# Patient Record
Sex: Male | Born: 1980 | Race: Black or African American | Hispanic: No | Smoking: Never smoker
Health system: Southern US, Community
[De-identification: ages and names within clinical notes are randomized; demographics above are authoritative.]

## PROBLEM LIST (undated history)

## (undated) DIAGNOSIS — M1990 Unspecified osteoarthritis, unspecified site: Secondary | ICD-10-CM

## (undated) DIAGNOSIS — T7840XA Allergy, unspecified, initial encounter: Secondary | ICD-10-CM

## (undated) DIAGNOSIS — F4310 Post-traumatic stress disorder, unspecified: Secondary | ICD-10-CM

## (undated) DIAGNOSIS — K219 Gastro-esophageal reflux disease without esophagitis: Secondary | ICD-10-CM

## (undated) HISTORY — DX: Post-traumatic stress disorder, unspecified: F43.10

## (undated) HISTORY — DX: Allergy, unspecified, initial encounter: T78.40XA

## (undated) HISTORY — DX: Gastro-esophageal reflux disease without esophagitis: K21.9

## (undated) HISTORY — DX: Unspecified osteoarthritis, unspecified site: M19.90

---

## 2015-03-26 DIAGNOSIS — K219 Gastro-esophageal reflux disease without esophagitis: Secondary | ICD-10-CM | POA: Insufficient documentation

## 2015-03-26 DIAGNOSIS — F419 Anxiety disorder, unspecified: Secondary | ICD-10-CM | POA: Insufficient documentation

## 2016-08-16 ENCOUNTER — Other Ambulatory Visit: Payer: Self-pay | Admitting: Adult Health

## 2016-08-16 DIAGNOSIS — R1030 Lower abdominal pain, unspecified: Secondary | ICD-10-CM

## 2016-08-23 ENCOUNTER — Ambulatory Visit: Payer: BLUE CROSS/BLUE SHIELD | Attending: Adult Health

## 2016-10-25 DIAGNOSIS — E669 Obesity, unspecified: Secondary | ICD-10-CM | POA: Insufficient documentation

## 2016-11-01 LAB — LIPID PANEL
Cholesterol: 151 (ref 0–200)
HDL: 43 (ref 35–70)
LDL Cholesterol: 93
Triglycerides: 75 (ref 40–160)

## 2016-11-01 LAB — HEPATIC FUNCTION PANEL
ALT: 11 (ref 10–40)
AST: 13 — AB (ref 14–40)

## 2016-11-01 LAB — CBC AND DIFFERENTIAL
Hemoglobin: 14 (ref 13.5–17.5)
WBC: 6.8

## 2016-11-01 LAB — BASIC METABOLIC PANEL
BUN: 13 (ref 4–21)
Creatinine: 1 (ref 0.6–1.3)
Potassium: 4.3 (ref 3.4–5.3)
Sodium: 141 (ref 137–147)

## 2017-01-10 ENCOUNTER — Encounter: Payer: Self-pay | Admitting: Emergency Medicine

## 2017-01-10 ENCOUNTER — Emergency Department
Admission: EM | Admit: 2017-01-10 | Discharge: 2017-01-10 | Disposition: A | Discharge status: Home / Self Care | Payer: BLUE CROSS/BLUE SHIELD | Attending: Emergency Medicine | Admitting: Emergency Medicine

## 2017-01-10 DIAGNOSIS — R0981 Nasal congestion: Secondary | ICD-10-CM | POA: Diagnosis present

## 2017-01-10 DIAGNOSIS — J069 Acute upper respiratory infection, unspecified: Secondary | ICD-10-CM

## 2017-01-10 DIAGNOSIS — J019 Acute sinusitis, unspecified: Secondary | ICD-10-CM | POA: Diagnosis not present

## 2017-01-10 DIAGNOSIS — H66003 Acute suppurative otitis media without spontaneous rupture of ear drum, bilateral: Secondary | ICD-10-CM | POA: Diagnosis not present

## 2017-01-10 DIAGNOSIS — J0191 Acute recurrent sinusitis, unspecified: Secondary | ICD-10-CM

## 2017-01-10 DIAGNOSIS — H66006 Acute suppurative otitis media without spontaneous rupture of ear drum, recurrent, bilateral: Secondary | ICD-10-CM

## 2017-01-10 DIAGNOSIS — B9789 Other viral agents as the cause of diseases classified elsewhere: Secondary | ICD-10-CM

## 2017-01-10 MED ORDER — HYDROCODONE-ACETAMINOPHEN 5-325 MG PO TABS
1.0000 | ORAL_TABLET | Freq: Once | ORAL | Status: AC
  Administered 2017-01-10: 1 via ORAL
  Filled 2017-01-10: qty 1

## 2017-01-10 MED ORDER — AMOXICILLIN-POT CLAVULANATE 875-125 MG PO TABS: 1.0000 | ORAL_TABLET | Freq: Two times a day (BID) | ORAL | 0 refills | Status: AC

## 2017-01-10 MED ORDER — AMOXICILLIN-POT CLAVULANATE 875-125 MG PO TABS
1.0000 | ORAL_TABLET | Freq: Once | ORAL | Status: AC
  Administered 2017-01-10: 1 via ORAL
  Filled 2017-01-10 (×2): qty 1

## 2017-01-10 MED ORDER — HYDROCODONE-HOMATROPINE 5-1.5 MG/5ML PO SYRP: 5.0000 mL | ORAL_SOLUTION | Freq: Four times a day (QID) | ORAL | 0 refills | Status: DC | PRN

## 2017-01-10 MED ORDER — ONDANSETRON 4 MG PO TBDP: ORAL_TABLET | ORAL | 0 refills | Status: DC

## 2017-01-10 NOTE — ED Triage Notes (Signed)
Pt with cough and sinus congestion

## 2017-01-10 NOTE — ED Provider Notes (Addendum)
[facility] Emergency Department Provider Note  ____________________________________________   First MD Initiated Contact with Patient 01/10/17 2012     (approximate)  I have reviewed the triage vital signs and the nursing notes.   HISTORY  Chief Complaint Cough and Nasal Congestion    HPI Christopher Harrington is a 36 y.o. male with a history of prior ear infections and sinus infections who presents for evaluation of persistent cough, sinus pain, and ear pain for the last several days.  He reports that he had a flulike illness about 2 weeks ago and after a week he was feeling better.  However, over the last several days hehas been feeling worse again.  He reports that the pain in both of his ears as severe and nothing makes it better or worse.  He feels like there is fluid in his ears.  Additionally has pain in the sinuses and the front of his face and feels a lot of nasal congestion.  He has a persistent cough that is making it difficult to sleep and it is virtually productive of yellow and green sputum.  He denies fever/chills, body aches.  Additionally, his son has been ill with vomiting and diarrhea recently and the patient also developed the symptoms starting yesterday, but he reports he is not very worried about that as he knows it is a virus and will get better.  He reports that he was last on antibiotics for his ear and sinus problems about 3 months ago.  He has seen Dr. Jenne Campus in the past.   History reviewed. No pertinent past medical history.  There are no active problems to display for this patient.   History reviewed. No pertinent surgical history.  Prior to Admission medications   Medication Sig Start Date End Date Taking? Authorizing Provider  amoxicillin-clavulanate (AUGMENTIN) 875-125 MG tablet Take 1 tablet by mouth every 12 (twelve) hours. 01/10/17 01/20/17  Loleta Rose, MD  HYDROcodone-homatropine [facility]) 5-1.5 MG/5ML syrup Take 5 mLs by mouth  every 6 (six) hours as needed for cough. 01/10/17   Loleta Rose, MD  ondansetron (ZOFRAN ODT) 4 MG disintegrating tablet Allow 1-2 tablets to dissolve in your mouth every 8 hours as needed for nausea/vomiting 01/10/17   Loleta Rose, MD    Allergies Patient has no known allergies.  No family history on file.  Social History Social History  Substance Use Topics  . Smoking status: Never Smoker  . Smokeless tobacco: Never Used  . Alcohol use No    Review of Systems Constitutional: No fever/chills Eyes: No visual changes. ENT: Nasal congestion and sinus pain.  Bilateral ear pain. Cardiovascular: Denies chest pain. Respiratory: No shortness of breath, but persistent productive cough Gastrointestinal: No abdominal pain.  No nausea, no vomiting.  No diarrhea.  No constipation. Genitourinary: Negative for dysuria. Musculoskeletal: Negative for back pain. Skin: Negative for rash. Neurological: Negative for headaches, focal weakness or numbness.  10-point ROS otherwise negative.  ____________________________________________   PHYSICAL EXAM:  VITAL SIGNS: ED Triage Vitals [01/10/17 1827]  Enc Vitals Group     BP (!) 145/77     Pulse Rate 78     Resp 20     Temp 98 F (36.7 C)     Temp Source Oral     SpO2 96 %     Weight 180 lb (81.6 kg)     Height 5\' 9"  (1.753 m)     Head Circumference      Peak Flow  Pain Score      Pain Loc      Pain Edu?      Excl. in GC?     Constitutional: Alert and oriented. Well appearing and in no acute distress. Eyes: Conjunctivae are normal. PERRL. EOMI. Head: Atraumatic. Ears:  The patient has mild erythema in bilateral canals but also has acute suppurative otitis media bilaterally.  There is no evident per perforation of the tympanic membranes.  The right is worse than the left. Nose: Nasal congestion.  No tenderness to palpation of the frontal or maxillary sinuses. Mouth/Throat: Mucous membranes are moist. Neck: No stridor.  No  meningeal signs.   Cardiovascular: Normal rate, regular rhythm. Good peripheral circulation. Grossly normal heart sounds. Respiratory: Normal respiratory effort.  No retractions. Lungs CTAB. Gastrointestinal: Soft and nontender. No distention.  Musculoskeletal: No lower extremity tenderness nor edema. No gross deformities of extremities. Neurologic:  Normal speech and language. No gross focal neurologic deficits are appreciated.  Skin:  Skin is warm, dry and intact. No rash noted. Psychiatric: Mood and affect are normal. Speech and behavior are normal.  ____________________________________________   LABS (all labs ordered are listed, but only abnormal results are displayed)  Labs Reviewed - No data to display ____________________________________________  EKG  None - EKG not ordered by ED physician ____________________________________________  RADIOLOGY   No results found.  ____________________________________________   PROCEDURES  Procedure(s) performed:   Procedures   Critical Care performed: No ____________________________________________   INITIAL IMPRESSION / ASSESSMENT AND PLAN / ED COURSE  Pertinent labs & imaging results that were available during my care of the patient were reviewed by me and considered in my medical decision making (see chart for details).  The patient is well-appearing and in no acute distress.  His lung sounds are clear in spite of his persistent cough.  I believe he most likely did have a viral illness or influenza and has some persistent viral bronchitis but there is no evidence of pneumonia superinfection and no indication for chest x-ray at this time.  However his ears do appear to be acutely infected and required antibiotics which should in theory also treat a community-acquired pneumonia.  I encouraged him to follow up with an ENT specialist given that he is having recurrent issues with infections and sinusitis.  I gave my usual and  customary return precautions.  Giving first dose here and a Norco (for cough suppression) since pharmacies are currently closed.       ____________________________________________  FINAL CLINICAL IMPRESSION(S) / ED DIAGNOSES  Final diagnoses:  Recurrent acute suppurative otitis media without spontaneous rupture of tympanic membrane of both sides  Viral URI with cough  Acute recurrent sinusitis, unspecified location     MEDICATIONS GIVEN DURING THIS VISIT:  Medications  amoxicillin-clavulanate (AUGMENTIN) 875-125 MG per tablet 1 tablet   HYDROcodone-acetaminophen (NORCO/VICODIN) 5-325 MG per tablet 1 tablet      NEW OUTPATIENT MEDICATIONS STARTED DURING THIS VISIT:  New Prescriptions   AMOXICILLIN-CLAVULANATE (AUGMENTIN) 875-125 MG TABLET    Take 1 tablet by mouth every 12 (twelve) hours.   HYDROCODONE-HOMATROPINE (HYCODAN) 5-1.5 MG/5ML SYRUP    Take 5 mLs by mouth every 6 (six) hours as needed for cough.   ONDANSETRON (ZOFRAN ODT) 4 MG DISINTEGRATING TABLET    Allow 1-2 tablets to dissolve in your mouth every 8 hours as needed for nausea/vomiting    Modified Medications   No medications on file    Discontinued Medications   No medications on file  Note:  This document was prepared using Dragon voice recognition software and may include unintentional dictation errors.    Loleta Rose, MD 01/10/17 1610    Loleta Rose, MD 01/10/17 2034

## 2017-01-10 NOTE — Discharge Instructions (Signed)
We believe that you have a viral, flulike infection that is causing most severe symptoms.  However you do appear to have an acute infection in both of your ears which has happened to him before.  We prescribed antibiotics and encourage you to take the full course (10 days).  Please follow up with Dr. Jenne CampusMcQueen or another ENT specialist since he continue having problems with your ears and your sinuses.

## 2017-01-10 NOTE — ED Notes (Signed)

## 2017-07-14 ENCOUNTER — Ambulatory Visit: Payer: Self-pay | Admitting: Internal Medicine

## 2017-10-13 ENCOUNTER — Encounter: Payer: Self-pay | Admitting: Internal Medicine

## 2017-10-16 ENCOUNTER — Ambulatory Visit: Payer: 59 | Admitting: Internal Medicine

## 2017-10-16 ENCOUNTER — Encounter: Payer: Self-pay | Admitting: Internal Medicine

## 2017-10-16 VITALS — BP 136/86 | HR 62 | Ht 69.0 in | Wt 190.0 lb

## 2017-10-16 DIAGNOSIS — H65115 Acute and subacute allergic otitis media (mucoid) (sanguinous) (serous), recurrent, left ear: Secondary | ICD-10-CM

## 2017-10-16 DIAGNOSIS — L409 Psoriasis, unspecified: Secondary | ICD-10-CM | POA: Insufficient documentation

## 2017-10-16 DIAGNOSIS — F419 Anxiety disorder, unspecified: Secondary | ICD-10-CM

## 2017-10-16 DIAGNOSIS — Z8669 Personal history of other diseases of the nervous system and sense organs: Secondary | ICD-10-CM | POA: Diagnosis not present

## 2017-10-16 DIAGNOSIS — R69 Illness, unspecified: Secondary | ICD-10-CM | POA: Diagnosis not present

## 2017-10-16 DIAGNOSIS — K219 Gastro-esophageal reflux disease without esophagitis: Secondary | ICD-10-CM

## 2017-10-16 MED ORDER — AMOXICILLIN-POT CLAVULANATE 875-125 MG PO TABS: 1.0000 | ORAL_TABLET | Freq: Two times a day (BID) | ORAL | 0 refills | Status: DC

## 2017-10-16 NOTE — Progress Notes (Signed)
Date:  10/16/2017   Name:  Christopher Harrington   DOB:  1981/06/04   MRN:  [id number]   Chief Complaint: Establish Care and Ear Pain (Left ear painful. States get ear infection once a year. Seen specialist- ) Gastroesophageal Reflux  He complains of heartburn and a sore throat. He reports no abdominal pain or no chest pain. This is a recurrent problem. The problem occurs rarely. The symptoms are aggravated by certain foods (essentially resolved after diet change). Pertinent negatives include no fatigue. He has tried a diet change for the symptoms.  Otalgia   There is pain in both ears. This is a recurrent problem. The current episode started more than 1 year ago. There has been no fever. The patient is experiencing no pain. Associated symptoms include a rash and a sore throat. Pertinent negatives include no abdominal pain or headaches. Treatments tried: flonase spray has reduced the frequency.  Rash  This is a chronic problem. The affected locations include the left elbow, right elbow and right lower leg. Rash characteristics: psoriasis. Associated symptoms include a sore throat. Pertinent negatives include no fatigue, fever or shortness of breath.     Review of Systems  Constitutional: Negative for chills, fatigue, fever and unexpected weight change.  HENT: Positive for ear pain, sinus pressure and sore throat. Negative for trouble swallowing.   Eyes: Negative for visual disturbance.  Respiratory: Negative for chest tightness and shortness of breath.   Cardiovascular: Negative for chest pain, palpitations and leg swelling.  Gastrointestinal: Positive for heartburn. Negative for abdominal pain.  Musculoskeletal: Negative for arthralgias and gait problem.  Skin: Positive for rash.  Neurological: Negative for dizziness and headaches.    Patient Active Problem List   Diagnosis Date Noted  . Psoriasis 10/16/2017  . Obesity (BMI 30.0-34.9) 10/25/2016  . Anxiety 03/26/2015  . GERD without  esophagitis 03/26/2015    Prior to Admission medications   Medication Sig Start Date End Date Taking? Authorizing Provider  fluticasone (FLONASE) 50 MCG/ACT nasal spray Place daily into both nostrils.   Yes [provider]  Probiotic Product (PROBIOTIC-10) CAPS Take by mouth.   Yes [provider]    No Known Allergies  History reviewed. No pertinent surgical history.  Social History   Tobacco Use  . Smoking status: Never Smoker  . Smokeless tobacco: Never Used  Substance Use Topics  . Alcohol use: Yes    Comment: socially- once a year  . Drug use: No     Medication list has been reviewed and updated.  PHQ 2/9 Scores 10/16/2017  PHQ - 2 Score 0    Physical Exam  Constitutional: He is oriented to person, place, and time. He appears well-developed. No distress.  HENT:  Head: Normocephalic and atraumatic.  Right Ear: Tympanic membrane is scarred. A middle ear effusion is present.  Left Ear: Tympanic membrane is retracted. Tympanic membrane is not perforated and not erythematous.  Nose: Right sinus exhibits no maxillary sinus tenderness and no frontal sinus tenderness. Left sinus exhibits no maxillary sinus tenderness and no frontal sinus tenderness.  Mouth/Throat: No posterior oropharyngeal edema or posterior oropharyngeal erythema.  Neck: Normal range of motion. Neck supple.  Cardiovascular: Normal rate, regular rhythm and normal heart sounds.  Pulmonary/Chest: Effort normal and breath sounds normal. No respiratory distress. He has no wheezes.  Musculoskeletal: Normal range of motion.  Neurological: He is alert and oriented to person, place, and time.  Skin: Skin is warm and dry. No rash noted.  Psoriatic plaques on left knee, right shin, both elbows and few small lesions on scalp  Psychiatric: He has a normal mood and affect. His behavior is normal. Thought content normal.  Nursing note and vitals reviewed.   BP 136/86   Pulse 62   Ht 5\' 9"  (1.753  m)   Wt 190 lb (86.2 kg)   SpO2 97%   BMI 28.06 kg/m   Assessment and Plan: 1. Recurrent acute allergic otitis media of left ear Continue flonase ENT follow up if persistent - amoxicillin-clavulanate (AUGMENTIN) 875-125 MG tablet; Take 1 tablet 2 (two) times daily by mouth.  Dispense: 20 tablet; Refill: 0  2. Psoriasis Continue topical steroid Call for refill and name of cream when needed  3. GERD without esophagitis Resolved with diet change   Meds ordered this encounter  Medications  . amoxicillin-clavulanate (AUGMENTIN) 875-125 MG tablet    Sig: Take 1 tablet 2 (two) times daily by mouth.    Dispense:  20 tablet    Refill:  0    Partially dictated using Animal nutritionistDragon software. Any errors are unintentional.  Bari EdwardLaura Acen Craun, MD [facility]  10/16/2017

## 2017-11-17 ENCOUNTER — Encounter: Payer: Self-pay | Admitting: Internal Medicine

## 2017-11-17 ENCOUNTER — Ambulatory Visit (INDEPENDENT_AMBULATORY_CARE_PROVIDER_SITE_OTHER): Payer: 59 | Admitting: Internal Medicine

## 2017-11-17 VITALS — BP 128/82 | HR 73 | Ht 69.0 in | Wt 186.0 lb

## 2017-11-17 DIAGNOSIS — M62838 Other muscle spasm: Secondary | ICD-10-CM

## 2017-11-17 MED ORDER — NAPROXEN 500 MG PO TABS: 500.0000 mg | ORAL_TABLET | Freq: Two times a day (BID) | ORAL | 0 refills | Status: DC

## 2017-11-17 MED ORDER — CYCLOBENZAPRINE HCL 10 MG PO TABS: 10.0000 mg | ORAL_TABLET | Freq: Three times a day (TID) | ORAL | 0 refills | Status: DC

## 2017-11-17 NOTE — Progress Notes (Signed)
Date:  11/17/2017   Name:  Christopher Harrington   DOB:  12/21/1980   MRN:  [id number]   Chief Complaint: Arm Pain (Monday when snowed had to push car out and noticed pain in right upper back. Pain radiates into right arm. Was having spasms in rigth chest, back, and arm. CONSTANT now and now having difficulty working right right arm. )  Arm Pain   The incident occurred more than 1 week ago. The incident occurred at home. Injury mechanism: after pushing a car out of the snow. The pain is present in the right clavicle, right shoulder and upper right arm. The quality of the pain is described as aching. The pain has been intermittent since the incident. Pertinent negatives include no chest pain or numbness. He has tried NSAIDs for the symptoms. The treatment provided mild relief.     Review of Systems  Constitutional: Negative for chills, fatigue and fever.  Respiratory: Negative for chest tightness, shortness of breath and wheezing.   Cardiovascular: Negative for chest pain and palpitations.  Gastrointestinal: Negative for abdominal pain.  Musculoskeletal: Positive for myalgias. Negative for neck pain and neck stiffness.  Neurological: Negative for weakness, numbness and headaches.  Psychiatric/Behavioral: Negative for sleep disturbance.    Patient Active Problem List   Diagnosis Date Noted  . Psoriasis 10/16/2017  . History of recurrent ear infection 10/16/2017  . Obesity (BMI 30.0-34.9) 10/25/2016  . Anxiety 03/26/2015  . GERD without esophagitis 03/26/2015    Prior to Admission medications   Medication Sig Start Date End Date Taking? Authorizing Provider  fluticasone (FLONASE) 50 MCG/ACT nasal spray Place daily into both nostrils.   Yes [provider]  Probiotic Product (PROBIOTIC-10) CAPS Take by mouth.   Yes [provider]    No Known Allergies  History reviewed. No pertinent surgical history.  Social History   Tobacco Use  . Smoking status: Never Smoker    . Smokeless tobacco: Never Used  Substance Use Topics  . Alcohol use: Yes    Comment: socially- once a year  . Drug use: No     Medication list has been reviewed and updated.  PHQ 2/9 Scores 10/16/2017  PHQ - 2 Score 0    Physical Exam  Constitutional: He is oriented to person, place, and time. He appears well-developed. No distress.  HENT:  Head: Normocephalic and atraumatic.  Cardiovascular: Normal rate, regular rhythm and normal heart sounds.  Pulmonary/Chest: Effort normal and breath sounds normal. No respiratory distress. He has no wheezes.  Musculoskeletal: Normal range of motion.       Cervical back: He exhibits normal range of motion, no tenderness and no bony tenderness.       Back:  Neurological: He is alert and oriented to person, place, and time. He has normal strength. No cranial nerve deficit or sensory deficit.  Skin: Skin is warm and dry. No rash noted.  Psychiatric: He has a normal mood and affect. His behavior is normal. Thought content normal.  Nursing note and vitals reviewed.   BP 128/82   Pulse 73   Ht 5\' 9"  (1.753 m)   Wt 186 lb (84.4 kg)   SpO2 97%   BMI 27.47 kg/m   Assessment and Plan: 1. Muscle spasms of neck Use heat, rest - cyclobenzaprine (FLEXERIL) 10 MG tablet; Take 1 tablet (10 mg total) by mouth 3 (three) times daily.  Dispense: 40 tablet; Refill: 0 - naproxen (NAPROSYN) 500 MG tablet; Take 1 tablet (500 mg  total) by mouth 2 (two) times daily with a meal.  Dispense: 60 tablet; Refill: 0   Meds ordered this encounter  Medications  . cyclobenzaprine (FLEXERIL) 10 MG tablet    Sig: Take 1 tablet (10 mg total) by mouth 3 (three) times daily.    Dispense:  40 tablet    Refill:  0  . naproxen (NAPROSYN) 500 MG tablet    Sig: Take 1 tablet (500 mg total) by mouth 2 (two) times daily with a meal.    Dispense:  60 tablet    Refill:  0    Partially dictated using Animal nutritionistDragon software. Any errors are unintentional.  Bari EdwardLaura Anner Baity,  MD [facility]  11/17/2017

## 2017-12-05 ENCOUNTER — Other Ambulatory Visit: Payer: Self-pay

## 2017-12-05 DIAGNOSIS — L409 Psoriasis, unspecified: Secondary | ICD-10-CM

## 2017-12-05 MED ORDER — CLOBETASOL PROPIONATE 0.05 % EX OINT
TOPICAL_OINTMENT | Freq: Two times a day (BID) | CUTANEOUS | Status: DC
Start: 2017-12-05 — End: 2020-04-23

## 2017-12-05 NOTE — Progress Notes (Signed)
Patient wife called requesting refill on clobetasol ointment. Per dr Jaclynn Guarneriberglunds office notes patient can call for refill if has name of ointment. [place] [facility] into [facility] [place]. Wife informed.

## 2017-12-08 ENCOUNTER — Other Ambulatory Visit: Payer: Self-pay

## 2017-12-08 MED ORDER — CLOBETASOL PROPIONATE 0.05 % EX OINT: 1.0000 "application " | TOPICAL_OINTMENT | Freq: Two times a day (BID) | CUTANEOUS | 0 refills | Status: DC

## 2018-01-30 ENCOUNTER — Other Ambulatory Visit: Payer: Self-pay

## 2018-01-30 MED ORDER — CLOBETASOL PROPIONATE 0.05 % EX OINT: 1.0000 "application " | TOPICAL_OINTMENT | Freq: Two times a day (BID) | CUTANEOUS | 5 refills | Status: DC

## 2018-04-17 DIAGNOSIS — H52223 Regular astigmatism, bilateral: Secondary | ICD-10-CM | POA: Diagnosis not present

## 2018-06-19 ENCOUNTER — Encounter: Payer: Self-pay | Admitting: Internal Medicine

## 2018-06-19 ENCOUNTER — Ambulatory Visit: Payer: 59 | Admitting: Internal Medicine

## 2018-06-19 VITALS — BP 138/88 | HR 68 | Ht 69.0 in | Wt 190.1 lb

## 2018-06-19 DIAGNOSIS — H6121 Impacted cerumen, right ear: Secondary | ICD-10-CM | POA: Diagnosis not present

## 2018-06-19 DIAGNOSIS — M79641 Pain in right hand: Secondary | ICD-10-CM | POA: Diagnosis not present

## 2018-06-19 NOTE — Progress Notes (Signed)
Date:  06/19/2018   Name:  Christopher Harrington   DOB:  [birth year]   MRN:  [id number]   Chief Complaint: Hand Pain (X 3 weeks ago. Was opening a jar and heard something pop. Since then cannot open anything with hand. Had to take 3 days off of work due to pain and unable to use hand. (RIGHT))  Hand Pain   The incident occurred at home. Injury mechanism: he was forcibly twisting a jar lid and heard a pop and had pain in the dorsum of his right hand. The pain is present in the right hand. The quality of the pain is described as aching. The pain does not radiate. The pain is moderate. The pain has been fluctuating since the incident. Associated symptoms include muscle weakness. Pertinent negatives include no numbness or tingling. The symptoms are aggravated by movement (mostly gripping). He has tried rest for the symptoms. The treatment provided mild relief.     Review of Systems  Constitutional: Negative for chills and fatigue.  Respiratory: Negative for chest tightness.   Musculoskeletal: Positive for arthralgias. Negative for joint swelling.  Skin: Negative for color change and rash.  Neurological: Negative for tingling, weakness and numbness.    Patient Active Problem List   Diagnosis Date Noted  . Psoriasis 10/16/2017  . History of recurrent ear infection 10/16/2017  . Obesity (BMI 30.0-34.9) 10/25/2016  . Anxiety 03/26/2015  . GERD without esophagitis 03/26/2015    Prior to Admission medications   Medication Sig Start Date End Date Taking? Authorizing Provider  clobetasol ointment (TEMOVATE) 0.05 % Apply 1 application topically 2 (two) times daily. 01/30/18  Yes Reubin MilanBerglund, Brianny Soulliere H, MD  fluticasone [facility](FLONASE) 50 MCG/ACT nasal spray Place daily into both nostrils.   Yes [provider]  Probiotic Product (PROBIOTIC-10) CAPS Take by mouth.   Yes [provider]    No Known Allergies  History reviewed. No pertinent surgical history.  Social History   Tobacco Use  .  Smoking status: Never Smoker  . Smokeless tobacco: Never Used  Substance Use Topics  . Alcohol use: Yes    Comment: socially- once a year  . Drug use: No     Medication list has been reviewed and updated.  Current Meds  Medication Sig  . clobetasol ointment (TEMOVATE) 0.05 % Apply 1 application topically 2 (two) times daily.  . fluticasone (FLONASE) 50 MCG/ACT nasal spray Place daily into both nostrils.  . Probiotic Product (PROBIOTIC-10) CAPS Take by mouth.   Current Facility-Administered Medications for the 06/19/18 encounter (Office Visit) with Reubin MilanBerglund, Narely Nobles H, MD  Medication  . clobetasol ointment (TEMOVATE) 0.05 %    PHQ 2/9 Scores 10/16/2017  PHQ - 2 Score 0    Physical Exam  Constitutional: He is oriented to person, place, and time. He appears well-developed. No distress.  HENT:  Head: Normocephalic and atraumatic.  Left Ear: Tympanic membrane and ear canal normal.  Right canal completely obscured by cerumen  Cardiovascular: Normal rate and regular rhythm.  Pulmonary/Chest: Effort normal and breath sounds normal. No respiratory distress.  Musculoskeletal: Normal range of motion.       Arms: Neurological: He is alert and oriented to person, place, and time.  Skin: Skin is warm and dry. No rash noted.  Psychiatric: He has a normal mood and affect. His behavior is normal. Thought content normal.  Nursing note and vitals reviewed.   BP 138/88   Pulse 68   Ht 5\' 9"  (1.753 m)  Wt 190 lb 1.6 oz (86.2 kg)   SpO2 97%   BMI 28.07 kg/m   Assessment and Plan: 1. Hand pain, right Suspect partial tendon rupture Recommend rest - letter for work given - Ambulatory referral to Orthopedic Surgery  2. Impacted cerumen of right ear See ENT if symptomatic  No orders of the defined types were placed in this encounter.   Partially dictated using Animal nutritionist. Any errors are unintentional.  Bari Edward, MD [facility]  06/19/2018   There are no diagnoses linked to this encounter.

## 2018-06-21 DIAGNOSIS — S63511A Sprain of carpal joint of right wrist, initial encounter: Secondary | ICD-10-CM | POA: Diagnosis not present

## 2018-06-28 ENCOUNTER — Telehealth: Payer: Self-pay

## 2018-06-28 ENCOUNTER — Encounter: Payer: Self-pay | Admitting: Internal Medicine

## 2018-06-28 NOTE — Telephone Encounter (Signed)
Needs  Return to work note. Check VM for more information.

## 2018-06-28 NOTE — Telephone Encounter (Signed)
Note written and placed at front desk. Pt aware. Thank you.

## 2018-07-02 ENCOUNTER — Emergency Department: Payer: 59

## 2018-07-02 ENCOUNTER — Emergency Department
Admission: EM | Admit: 2018-07-02 | Discharge: 2018-07-02 | Disposition: A | Discharge status: Home / Self Care | Payer: 59 | Attending: Emergency Medicine | Admitting: Emergency Medicine

## 2018-07-02 ENCOUNTER — Encounter: Payer: Self-pay | Admitting: Emergency Medicine

## 2018-07-02 ENCOUNTER — Other Ambulatory Visit: Payer: Self-pay

## 2018-07-02 DIAGNOSIS — Z79899 Other long term (current) drug therapy: Secondary | ICD-10-CM | POA: Insufficient documentation

## 2018-07-02 DIAGNOSIS — M5412 Radiculopathy, cervical region: Secondary | ICD-10-CM

## 2018-07-02 DIAGNOSIS — R200 Anesthesia of skin: Secondary | ICD-10-CM | POA: Diagnosis present

## 2018-07-02 DIAGNOSIS — M542 Cervicalgia: Secondary | ICD-10-CM | POA: Diagnosis not present

## 2018-07-02 MED ORDER — PREDNISONE 10 MG PO TABS: ORAL_TABLET | ORAL | 0 refills | Status: DC
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
## 2018-07-02 NOTE — Discharge Instructions (Addendum)
Follow-up with your primary care provider if any continued problems.  Begin taking prednisone as directed beginning with 6 tablets today and tapering down as directed.  You may use ice or heat to your neck and shoulder as needed.  You may also follow-up with the orthopedic if not improving in 2 to 3 weeks.  Dr. Rosita KeaMenz is the orthopedist on call today and his contact information is listed on your discharge papers.

## 2018-07-02 NOTE — ED Notes (Signed)
See triage note   States he developed some numbness to right arm on Saturday  Then states pain is going from neck into right arm  Having numbness to index finger and thumb  Denies any trauma  Good pulses

## 2018-07-02 NOTE — ED Triage Notes (Signed)
Complaining of numbness of thumb and index finger on right hand.

## 2018-07-02 NOTE — ED Triage Notes (Signed)
Patient complaining of pain right arm starting on Sat PM with neck pain, now complaining of pain in right arm.  Denies injury.

## 2018-07-05 DIAGNOSIS — M5412 Radiculopathy, cervical region: Secondary | ICD-10-CM | POA: Diagnosis not present

## 2018-07-09 ENCOUNTER — Telehealth: Payer: Self-pay

## 2018-07-09 NOTE — Telephone Encounter (Signed)
Patient informed to contact ortho to have further treatment. He verbalized understanding.

## 2018-07-09 NOTE — Telephone Encounter (Signed)
Patient called stating since his last appt discussing his hand pain he has seen ER who sent him to orthopedics. He stated in the ER they gave him prednisone. When he seen the Ortho Doctor which was last Thursday they just told him to continue the prednisone and pain should get better.  I told him he needs to f/up with Orthopedics if its still not better and he stated " they didn't do anything to help me." Stated the pain is "all day every day" and the prednisone is not helping.  Please Advise.

## 2018-07-09 NOTE — Telephone Encounter (Signed)
He needs to let Ortho know that he is not getting relief so they can see him again and provide other treatment.

## 2018-08-02 DIAGNOSIS — M5412 Radiculopathy, cervical region: Secondary | ICD-10-CM | POA: Diagnosis not present

## 2019-04-01 ENCOUNTER — Other Ambulatory Visit: Payer: Self-pay

## 2019-04-01 ENCOUNTER — Encounter: Payer: Self-pay | Admitting: Internal Medicine

## 2019-04-01 ENCOUNTER — Ambulatory Visit: Payer: 59 | Admitting: Internal Medicine

## 2019-04-01 VITALS — BP 138/88 | HR 64 | Ht 69.0 in | Wt 201.0 lb

## 2019-04-01 DIAGNOSIS — H6983 Other specified disorders of Eustachian tube, bilateral: Secondary | ICD-10-CM

## 2019-04-01 DIAGNOSIS — R350 Frequency of micturition: Secondary | ICD-10-CM

## 2019-04-01 DIAGNOSIS — L409 Psoriasis, unspecified: Secondary | ICD-10-CM

## 2019-04-01 DIAGNOSIS — H6993 Unspecified Eustachian tube disorder, bilateral: Secondary | ICD-10-CM | POA: Insufficient documentation

## 2019-04-01 MED ORDER — CLOBETASOL PROPIONATE 0.05 % EX OINT: 1.0000 "application " | TOPICAL_OINTMENT | Freq: Two times a day (BID) | CUTANEOUS | 5 refills | Status: DC

## 2019-04-01 NOTE — Progress Notes (Signed)
Date:  04/01/2019   Name:  Christopher HenleLarry Edward Harrington   DOB:  06-16-1981   MRN:  [id number]   Chief Complaint: Otalgia  Otalgia   There is pain in the left ear. This is a recurrent problem. There has been no fever. The pain is mild. Associated symptoms include a rash. Pertinent negatives include no abdominal pain, coughing, diarrhea, ear discharge, headaches, hearing loss, rhinorrhea, sore throat or vomiting. He has tried NSAIDs (and Flonase) for the symptoms. The treatment provided significant relief.  Rash  This is a chronic problem. The problem is unchanged. The affected locations include the left elbow, right elbow, left lower leg and right lower leg. The rash is characterized by redness, scaling and itchiness. Associated symptoms include congestion. Pertinent negatives include no cough, diarrhea, fatigue, fever, rhinorrhea, shortness of breath, sore throat or vomiting. Past treatments include topical steroids. The treatment provided moderate relief.  Urinary frequency - he has a hx of passing one kidney stone about 8 years ago. Since that time he has noticed frequent urination - feels like he empties completely but has to go back in an hour or less to urinate again.  Also has nocturia x 3 at least.  Review of Systems  Constitutional: Negative for chills, fatigue and fever.  HENT: Positive for congestion, ear pain and tinnitus. Negative for ear discharge, hearing loss, rhinorrhea, sore throat, trouble swallowing and voice change.   Respiratory: Negative for cough, chest tightness and shortness of breath.   Cardiovascular: Negative for chest pain, palpitations and leg swelling.  Gastrointestinal: Negative for abdominal pain, constipation, diarrhea and vomiting.  Genitourinary: Positive for frequency. Negative for difficulty urinating, hematuria and testicular pain.  Skin: Positive for rash.  Neurological: Negative for dizziness and headaches.    Patient Active Problem List   Diagnosis Date  Noted  . Psoriasis 10/16/2017  . History of recurrent ear infection 10/16/2017  . Obesity (BMI 30.0-34.9) 10/25/2016  . Anxiety 03/26/2015  . GERD without esophagitis 03/26/2015    No Known Allergies  History reviewed. No pertinent surgical history.  Social History   Tobacco Use  . Smoking status: Never Smoker  . Smokeless tobacco: Never Used  Substance Use Topics  . Alcohol use: Yes    Comment: socially- once a year  . Drug use: No     Medication list has been reviewed and updated.  Current Meds  Medication Sig  . clobetasol ointment (TEMOVATE) 0.05 % Apply 1 application topically 2 (two) times daily.  . fluticasone (FLONASE) 50 MCG/ACT nasal spray Place daily into both nostrils.  . Probiotic Product (PROBIOTIC-10) CAPS Take by mouth.   Current Facility-Administered Medications for the 04/01/19 encounter (Office Visit) with Reubin MilanBerglund, Richar Dunklee H, MD  Medication  . clobetasol ointment (TEMOVATE) 0.05 %    PHQ 2/9 Scores 04/01/2019 10/16/2017  PHQ - 2 Score 0 0    BP Readings from Last 3 Encounters:  04/01/19 138/88  07/02/18 130/87  06/19/18 138/88    Physical Exam Vitals signs and nursing note reviewed.  Constitutional:      General: He is not in acute distress.    Appearance: He is well-developed.  HENT:     Head: Normocephalic and atraumatic.     Right Ear: Tympanic membrane, ear canal and external ear normal. Tympanic membrane is not perforated, erythematous or retracted.     Left Ear: Ear canal and external ear normal. Tympanic membrane is retracted. Tympanic membrane is not scarred, perforated or erythematous.  Nose:     Right Sinus: No maxillary sinus tenderness or frontal sinus tenderness.     Left Sinus: No maxillary sinus tenderness or frontal sinus tenderness.  Cardiovascular:     Rate and Rhythm: Normal rate and regular rhythm.  Pulmonary:     Effort: Pulmonary effort is normal. No respiratory distress.     Breath sounds: Normal breath sounds. No  wheezing or rhonchi.  Musculoskeletal: Normal range of motion.     Right lower leg: No edema.     Left lower leg: No edema.  Skin:    General: Skin is warm and dry.     Findings: Rash present.     Comments: Patchy psoriasis lesions on both elbows, larger areas on anterior shins   Neurological:     Mental Status: He is alert and oriented to person, place, and time.  Psychiatric:        Attention and Perception: Attention normal.        Behavior: Behavior normal.        Thought Content: Thought content normal.     Wt Readings from Last 3 Encounters:  04/01/19 201 lb (91.2 kg)  07/02/18 184 lb (83.5 kg)  06/19/18 190 lb 1.6 oz (86.2 kg)    BP 138/88   Pulse 64   Ht 5\' 9"  (1.753 m)   Wt 201 lb (91.2 kg)   SpO2 96%   BMI 29.68 kg/m   Assessment and Plan: 1. Eustachian tube dysfunction, bilateral Pt advised to take allergy medication like allegra along with Flonase every day  2. Psoriasis - clobetasol ointment (TEMOVATE) 0.05 %; Apply 1 application topically 2 (two) times daily.  Dispense: 60 g; Refill: 5  3. Urinary frequency - Ambulatory referral to Urology   Partially dictated using Dragon software. Any errors are unintentional.  Bari Edward, MD [facility]  04/01/2019

## 2019-05-08 ENCOUNTER — Ambulatory Visit: Payer: 59 | Admitting: Urology

## 2019-05-08 ENCOUNTER — Encounter: Payer: Self-pay | Admitting: Urology

## 2019-05-08 ENCOUNTER — Other Ambulatory Visit: Payer: Self-pay

## 2019-05-08 DIAGNOSIS — R350 Frequency of micturition: Secondary | ICD-10-CM | POA: Diagnosis not present

## 2019-05-08 LAB — MICROSCOPIC EXAMINATION
Bacteria, UA: NONE SEEN
RBC: NONE SEEN /hpf (ref 0–2)

## 2019-05-08 LAB — URINALYSIS, COMPLETE
Bilirubin, UA: NEGATIVE
Glucose, UA: NEGATIVE
Ketones, UA: NEGATIVE
Leukocytes,UA: NEGATIVE
Nitrite, UA: NEGATIVE
Protein,UA: NEGATIVE
RBC, UA: NEGATIVE
Specific Gravity, UA: 1.02 (ref 1.005–1.030)
Urobilinogen, Ur: 0.2 mg/dL (ref 0.2–1.0)
pH, UA: 6 (ref 5.0–7.5)

## 2019-05-08 LAB — BLADDER SCAN AMB NON-IMAGING: Scan Result: 0

## 2019-05-08 NOTE — Patient Instructions (Signed)
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
## 2019-06-10 ENCOUNTER — Encounter: Payer: 59 | Admitting: Internal Medicine

## 2020-01-07 ENCOUNTER — Encounter: Payer: Self-pay | Admitting: Internal Medicine

## 2020-01-07 ENCOUNTER — Ambulatory Visit: Payer: 59 | Admitting: Internal Medicine

## 2020-01-07 ENCOUNTER — Other Ambulatory Visit: Payer: Self-pay

## 2020-01-07 VITALS — BP 138/90 | HR 95 | Temp 98.4°F | Ht 69.0 in | Wt 208.0 lb

## 2020-01-07 DIAGNOSIS — K5900 Constipation, unspecified: Secondary | ICD-10-CM | POA: Insufficient documentation

## 2020-01-07 DIAGNOSIS — K5909 Other constipation: Secondary | ICD-10-CM | POA: Insufficient documentation

## 2020-01-07 NOTE — Patient Instructions (Addendum)
Miralax powder - take one dose every day for at least one week.  You can adjust the dose up or down as needed to maintain 1-2 stools per day.

## 2020-01-07 NOTE — Progress Notes (Signed)
Date:  01/07/2020   Name:  Christopher Harrington   DOB:  March 23, 1981   MRN:  [id number]   Chief Complaint: Abdominal Pain (lower right quad. constipated for a month. pain and bloating. painful when having a bm. last bm has been 4-5 days. )  Constipation This is a new problem. The current episode started 1 to 4 weeks ago. The problem is unchanged. His stool frequency is 1 time per week or less. The stool is described as firm and formed. The patient is not on a high fiber diet. He does not exercise regularly. There has not been adequate water intake. Associated symptoms include abdominal pain and bloating. Pertinent negatives include no anorexia, diarrhea, difficulty urinating, fever, nausea, vomiting or weight loss. He has tried nothing for the symptoms. There is no history of abdominal surgery, inflammatory bowel disease or irritable bowel syndrome.    Lab Results  Component Value Date   CREATININE 1.0 11/01/2016   BUN 13 11/01/2016   NA 141 11/01/2016   K 4.3 11/01/2016   Lab Results  Component Value Date   CHOL 151 11/01/2016   HDL 43 11/01/2016   LDLCALC 93 11/01/2016   TRIG 75 11/01/2016   No results found for: TSH No results found for: HGBA1C   Review of Systems  Constitutional: Negative for chills, fatigue, fever and weight loss.  Respiratory: Negative for cough, chest tightness and wheezing.   Gastrointestinal: Positive for abdominal distention, abdominal pain, bloating and constipation. Negative for anorexia, diarrhea, nausea and vomiting.  Genitourinary: Negative for difficulty urinating.  Neurological: Negative for dizziness, light-headedness and headaches.    Patient Active Problem List   Diagnosis Date Noted  . Eustachian tube dysfunction, bilateral 04/01/2019  . Psoriasis 10/16/2017  . History of recurrent ear infection 10/16/2017  . Obesity (BMI 30.0-34.9) 10/25/2016  . Anxiety 03/26/2015  . GERD without esophagitis 03/26/2015    No Known  Allergies  History reviewed. No pertinent surgical history.  Social History   Tobacco Use  . Smoking status: Never Smoker  . Smokeless tobacco: Never Used  Substance Use Topics  . Alcohol use: Yes    Comment: socially- once a year  . Drug use: No     Medication list has been reviewed and updated.  Current Meds  Medication Sig  . clobetasol ointment (TEMOVATE) 0.05 % Apply 1 application topically 2 (two) times daily.  . fluticasone (FLONASE) 50 MCG/ACT nasal spray Place daily into both nostrils.  Marland Kitchen UNABLE TO FIND Sea Moss Supplement   Current Facility-Administered Medications for the 01/07/20 encounter (Office Visit) with Reubin Milan, MD  Medication  . clobetasol ointment (TEMOVATE) 0.05 %    PHQ 2/9 Scores 01/07/2020 04/01/2019 10/16/2017  PHQ - 2 Score 0 0 0  PHQ- 9 Score 4 - -    BP Readings from Last 3 Encounters:  01/07/20 138/90  04/01/19 138/88  07/02/18 130/87    Physical Exam Vitals and nursing note reviewed.  Constitutional:      General: He is not in acute distress.    Appearance: He is well-developed.  HENT:     Head: Normocephalic and atraumatic.  Pulmonary:     Effort: Pulmonary effort is normal. No respiratory distress.  Abdominal:     General: Abdomen is flat. Bowel sounds are normal. There is no distension or abdominal bruit.     Palpations: Abdomen is soft.     Tenderness: There is abdominal tenderness in the right lower quadrant, suprapubic area and  left lower quadrant.     Comments: Very mild discomfort to deep palpation  Musculoskeletal:        General: Normal range of motion.  Skin:    General: Skin is warm and dry.     Capillary Refill: Capillary refill takes less than 2 seconds.     Findings: No rash.  Neurological:     Mental Status: He is alert and oriented to person, place, and time.  Psychiatric:        Behavior: Behavior normal.        Thought Content: Thought content normal.     Wt Readings from Last 3 Encounters:   01/07/20 208 lb (94.3 kg)  04/01/19 201 lb (91.2 kg)  07/02/18 184 lb (83.5 kg)    BP 138/90   Pulse 95   Temp 98.4 F (36.9 C) (Oral)   Ht 5\' 9"  (1.753 m)   Wt 208 lb (94.3 kg)   SpO2 96%   BMI 30.72 kg/m   Assessment and Plan: 1. Constipation, unspecified constipation type Recommend Miralax daily - adjust dose as needed Trial for one week and call if no improvement Return if symptoms change or worsen   Partially dictated using Editor, commissioning. Any errors are unintentional.  Halina Maidens, MD [facility]  01/07/2020

## 2020-03-18 ENCOUNTER — Ambulatory Visit: Payer: 59 | Admitting: Internal Medicine

## 2020-03-18 ENCOUNTER — Encounter: Payer: Self-pay | Admitting: Internal Medicine

## 2020-03-18 ENCOUNTER — Other Ambulatory Visit: Payer: Self-pay

## 2020-03-18 VITALS — BP 136/70 | HR 65 | Temp 98.0°F | Ht 69.0 in | Wt 207.0 lb

## 2020-03-18 DIAGNOSIS — M62838 Other muscle spasm: Secondary | ICD-10-CM | POA: Diagnosis not present

## 2020-03-18 DIAGNOSIS — M7071 Other bursitis of hip, right hip: Secondary | ICD-10-CM

## 2020-03-18 DIAGNOSIS — H6983 Other specified disorders of Eustachian tube, bilateral: Secondary | ICD-10-CM

## 2020-03-18 MED ORDER — METHOCARBAMOL 500 MG PO TABS: 500.0000 mg | ORAL_TABLET | Freq: Every evening | ORAL | 0 refills | Status: DC | PRN

## 2020-03-18 MED ORDER — PREDNISONE 10 MG PO TABS: 10.0000 mg | ORAL_TABLET | ORAL | 0 refills | Status: AC

## 2020-03-18 NOTE — Progress Notes (Signed)
Date:  03/18/2020   Name:  Christopher Harrington   DOB:  04-Feb-1981   MRN:  [id number]   Chief Complaint: Leg Pain (Right buttocks down into thigh. Hurts when sitting down at work, laying down in bed.  X several weeks. ) and Shoulder Injury (Left shoulder pain. 2 months. On and off. Feels pulling on neck when looking to the right side. Hurts into arm pit.)  Leg Pain  There was no injury mechanism. The pain is present in the right thigh. The quality of the pain is described as aching. The pain is mild. The pain has been fluctuating since onset. Pertinent negatives include no inability to bear weight, loss of motion, muscle weakness, numbness or tingling. He reports no foreign bodies present. Exacerbated by: worse when sitting and trying to sleep. He has tried NSAIDs for the symptoms. The treatment provided mild relief.  Neck Pain  This is a recurrent problem. The problem occurs intermittently. The pain is present in the left side. The quality of the pain is described as burning. The pain is mild. The symptoms are aggravated by twisting and stress. Associated symptoms include leg pain. Pertinent negatives include no chest pain, fever, numbness, pain with swallowing, paresis, tingling, visual change or weakness. He has tried NSAIDs for the symptoms.  Otalgia  There is pain in both ears. This is a chronic problem. The problem has been waxing and waning. There has been no fever. Associated symptoms include neck pain. Pertinent negatives include no coughing or hearing loss. He has tried NSAIDs (and flonase spray) for the symptoms.   DG Cervical Spine 2-3 Views CLINICAL DATA:  Right-sided neck pain radiating into the upper right shoulder and into the arm for the past 3 days. No known injury.  EXAM: CERVICAL SPINE - 2-3 VIEW  COMPARISON:  None in PACs  FINDINGS: The cervical vertebral bodies are preserved in height. There is mild disc space narrowing at C5-6 and at C6-7. There is no perched  facet or spinous process fracture. The prevertebral soft tissue spaces are normal. The odontoid is intact.  IMPRESSION: Mild degenerative disc space narrowing at C5-6 and C6-7. No acute bony abnormality.  Electronically Signed   By: David  [place] M.D.   On: 07/02/2018 09:57     Lab Results  Component Value Date   CREATININE 1.0 11/01/2016   BUN 13 11/01/2016   NA 141 11/01/2016   K 4.3 11/01/2016   Lab Results  Component Value Date   CHOL 151 11/01/2016   HDL 43 11/01/2016   LDLCALC 93 11/01/2016   TRIG 75 11/01/2016   No results found for: TSH No results found for: HGBA1C Lab Results  Component Value Date   WBC 6.8 11/01/2016   HGB 14.0 11/01/2016   Lab Results  Component Value Date   ALT 11 11/01/2016   AST 13 (A) 11/01/2016     Review of Systems  Constitutional: Negative for chills, fatigue and fever.  HENT: Positive for ear pain. Negative for hearing loss and tinnitus.   Respiratory: Negative for cough, chest tightness and shortness of breath.   Cardiovascular: Negative for chest pain, palpitations and leg swelling.  Musculoskeletal: Positive for arthralgias and neck pain. Negative for back pain, gait problem and joint swelling.  Neurological: Negative for tingling, weakness and numbness.    Patient Active Problem List   Diagnosis Date Noted  . Constipation 01/07/2020  . Eustachian tube dysfunction, bilateral 04/01/2019  . Psoriasis 10/16/2017  . History of  recurrent ear infection 10/16/2017  . Obesity (BMI 30.0-34.9) 10/25/2016  . Anxiety 03/26/2015  . GERD without esophagitis 03/26/2015    No Known Allergies  History reviewed. No pertinent surgical history.  Social History   Tobacco Use  . Smoking status: Never Smoker  . Smokeless tobacco: Never Used  Substance Use Topics  . Alcohol use: Yes    Comment: socially- once a year  . Drug use: No     Medication list has been reviewed and updated.  Current Meds  Medication Sig  .  clobetasol ointment (TEMOVATE) 8.67 % Apply 1 application topically 2 (two) times daily.  . fluticasone (FLONASE) 50 MCG/ACT nasal spray Place daily into both nostrils.  . Probiotic Product (PROBIOTIC-10) CAPS Take by mouth.  Marland Kitchen UNABLE TO FIND Sea Moss Supplement   Current Facility-Administered Medications for the 03/18/20 encounter (Office Visit) with Glean Hess, MD  Medication  . clobetasol ointment (TEMOVATE) 0.05 %    PHQ 2/9 Scores 03/18/2020 01/07/2020 04/01/2019 10/16/2017  PHQ - 2 Score 0 0 0 0  PHQ- 9 Score 0 4 - -    BP Readings from Last 3 Encounters:  03/18/20 136/70  01/07/20 138/90  04/01/19 138/88    Physical Exam Vitals and nursing note reviewed.  Constitutional:      General: He is not in acute distress.    Appearance: Normal appearance. He is well-developed.  HENT:     Head: Normocephalic and atraumatic.     Right Ear: Ear canal normal. Tympanic membrane is retracted.     Left Ear: Ear canal normal. Tympanic membrane is retracted.  Cardiovascular:     Rate and Rhythm: Normal rate and regular rhythm.  Pulmonary:     Effort: Pulmonary effort is normal. No respiratory distress.     Breath sounds: No wheezing or rhonchi.  Musculoskeletal:     Cervical back: Normal range of motion. Spasms (on the left) and tenderness present. No swelling, edema or bony tenderness.     Lumbar back: No spasms, tenderness or bony tenderness. Negative right straight leg raise test and negative left straight leg raise test.     Right hip: Bony tenderness present. Decreased range of motion. Normal strength.     Left hip: No bony tenderness. Normal range of motion. Normal strength.  Lymphadenopathy:     Cervical: No cervical adenopathy.  Skin:    General: Skin is warm and dry.     Findings: No rash.  Neurological:     Mental Status: He is alert and oriented to person, place, and time.  Psychiatric:        Attention and Perception: Attention normal.        Mood and Affect: Mood  normal.        Behavior: Behavior normal.     Wt Readings from Last 3 Encounters:  03/18/20 207 lb (93.9 kg)  01/07/20 208 lb (94.3 kg)  04/01/19 201 lb (91.2 kg)    BP 136/70   Pulse 65   Temp 98 F (36.7 C) (Oral)   Ht 5\' 9"  (1.753 m)   Wt 207 lb (93.9 kg)   SpO2 95%   BMI 30.57 kg/m   Assessment and Plan: 1. Bursitis of right hip, unspecified bursa Suspect his thigh pain is bursitis Hold Advil while taking prednisone - predniSONE (DELTASONE) 10 MG tablet; Take 1 tablet (10 mg total) by mouth as directed for 6 days. Take 6,5,4,3,2,1 then stop  Dispense: 21 tablet; Refill: 0  2. Eustachian  tube dysfunction, bilateral Continue Flonase No evidence of infection  3. Muscle spasms of neck Use heat or ice on neck and shoulder Use muscle relaxants at bedtime PRN Continue PRN Advil after prednisone taper - methocarbamol (ROBAXIN) 500 MG tablet; Take 1 tablet (500 mg total) by mouth at bedtime as needed for muscle spasms.  Dispense: 30 tablet; Refill: 0   Partially dictated using Animal nutritionist. Any errors are unintentional.  Bari Edward, MD [facility]  03/18/2020

## 2020-04-23 ENCOUNTER — Ambulatory Visit
Admission: RE | Admit: 2020-04-23 | Discharge: 2020-04-23 | Disposition: A | Discharge status: Home / Self Care | Payer: 59 | Source: Ambulatory Visit | Attending: Internal Medicine | Admitting: Internal Medicine

## 2020-04-23 ENCOUNTER — Encounter: Payer: Self-pay | Admitting: Internal Medicine

## 2020-04-23 ENCOUNTER — Ambulatory Visit
Admission: RE | Admit: 2020-04-23 | Discharge: 2020-04-23 | Disposition: A | Discharge status: Home / Self Care | Payer: 59 | Attending: Internal Medicine | Admitting: Internal Medicine

## 2020-04-23 ENCOUNTER — Other Ambulatory Visit: Payer: Self-pay

## 2020-04-23 ENCOUNTER — Ambulatory Visit (INDEPENDENT_AMBULATORY_CARE_PROVIDER_SITE_OTHER): Payer: 59 | Admitting: Internal Medicine

## 2020-04-23 ENCOUNTER — Other Ambulatory Visit
Admission: RE | Admit: 2020-04-23 | Discharge: 2020-04-23 | Disposition: A | Discharge status: Home / Self Care | Payer: 59 | Source: Home / Self Care | Attending: Internal Medicine | Admitting: Internal Medicine

## 2020-04-23 ENCOUNTER — Telehealth: Payer: Self-pay | Admitting: Internal Medicine

## 2020-04-23 VITALS — BP 132/68 | HR 67 | Temp 97.9°F | Ht 69.0 in | Wt 199.0 lb

## 2020-04-23 DIAGNOSIS — L409 Psoriasis, unspecified: Secondary | ICD-10-CM | POA: Diagnosis not present

## 2020-04-23 DIAGNOSIS — M5442 Lumbago with sciatica, left side: Secondary | ICD-10-CM

## 2020-04-23 DIAGNOSIS — Z114 Encounter for screening for human immunodeficiency virus [HIV]: Secondary | ICD-10-CM

## 2020-04-23 DIAGNOSIS — R69 Illness, unspecified: Secondary | ICD-10-CM | POA: Diagnosis not present

## 2020-04-23 DIAGNOSIS — Z0000 Encounter for general adult medical examination without abnormal findings: Secondary | ICD-10-CM

## 2020-04-23 DIAGNOSIS — M545 Low back pain: Secondary | ICD-10-CM | POA: Diagnosis not present

## 2020-04-23 LAB — CBC WITH DIFFERENTIAL/PLATELET
Abs Immature Granulocytes: 0.05 10*3/uL (ref 0.00–0.07)
Basophils Absolute: 0 10*3/uL (ref 0.0–0.1)
Basophils Relative: 1 %
Eosinophils Absolute: 0.1 10*3/uL (ref 0.0–0.5)
Eosinophils Relative: 1 %
HCT: 41.1 % (ref 39.0–52.0)
Hemoglobin: 13.8 g/dL (ref 13.0–17.0)
Immature Granulocytes: 1 %
Lymphocytes Relative: 23 %
Lymphs Abs: 1.8 10*3/uL (ref 0.7–4.0)
MCH: 29.4 pg (ref 26.0–34.0)
MCHC: 33.6 g/dL (ref 30.0–36.0)
MCV: 87.4 fL (ref 80.0–100.0)
Monocytes Absolute: 0.6 10*3/uL (ref 0.1–1.0)
Monocytes Relative: 7 %
Neutro Abs: 5.2 10*3/uL (ref 1.7–7.7)
Neutrophils Relative %: 67 %
Platelets: 279 10*3/uL (ref 150–400)
RBC: 4.7 MIL/uL (ref 4.22–5.81)
RDW: 13.1 % (ref 11.5–15.5)
WBC: 7.6 10*3/uL (ref 4.0–10.5)
nRBC: 0 % (ref 0.0–0.2)

## 2020-04-23 LAB — POCT URINALYSIS DIPSTICK
Bilirubin, UA: NEGATIVE
Blood, UA: NEGATIVE
Glucose, UA: NEGATIVE
Ketones, UA: NEGATIVE
Leukocytes, UA: NEGATIVE
Nitrite, UA: NEGATIVE
Protein, UA: NEGATIVE
Spec Grav, UA: 1.01 (ref 1.010–1.025)
Urobilinogen, UA: 0.2 E.U./dL
pH, UA: 5 (ref 5.0–8.0)

## 2020-04-23 LAB — HIV ANTIBODY (ROUTINE TESTING W REFLEX): HIV Screen 4th Generation wRfx: NONREACTIVE

## 2020-04-23 LAB — COMPREHENSIVE METABOLIC PANEL
ALT: 26 U/L (ref 0–44)
AST: 19 U/L (ref 15–41)
Albumin: 4.3 g/dL (ref 3.5–5.0)
Alkaline Phosphatase: 55 U/L (ref 38–126)
Anion gap: 7 (ref 5–15)
BUN: 14 mg/dL (ref 6–20)
CO2: 25 mmol/L (ref 22–32)
Calcium: 8.8 mg/dL — ABNORMAL LOW (ref 8.9–10.3)
Chloride: 104 mmol/L (ref 98–111)
Creatinine, Ser: 1.03 mg/dL (ref 0.61–1.24)
GFR calc Af Amer: >60 mL/min (ref >60)
GFR calc non Af Amer: >60 mL/min (ref >60)
Glucose, Bld: 101 mg/dL — ABNORMAL HIGH (ref 70–99)
Potassium: 3.8 mmol/L (ref 3.5–5.1)
Sodium: 136 mmol/L (ref 135–145)
Total Bilirubin: 1.1 mg/dL (ref 0.3–1.2)
Total Protein: 7.6 g/dL (ref 6.5–8.1)

## 2020-04-23 LAB — LIPID PANEL
Cholesterol: 150 mg/dL (ref 0–200)
HDL: 49 mg/dL (ref >40)
LDL Cholesterol: 91 mg/dL (ref 0–99)
Total CHOL/HDL Ratio: 3.1 RATIO
Triglycerides: 51 mg/dL (ref <150)
VLDL: 10 mg/dL (ref 0–40)

## 2020-04-23 MED ORDER — CYCLOBENZAPRINE HCL 10 MG PO TABS: 10.0000 mg | ORAL_TABLET | Freq: Three times a day (TID) | ORAL | 0 refills | Status: DC | PRN

## 2020-04-23 MED ORDER — MELOXICAM 15 MG PO TABS: 15.0000 mg | ORAL_TABLET | Freq: Every day | ORAL | 0 refills | Status: DC

## 2020-04-23 NOTE — Telephone Encounter (Signed)
Back xrays are normal.  This is good news and means that the pain is soft tissue related and not likely to be a herniated disc.  He should use the muscle relaxant and Meloxicam.  If no improvement, will refer to Ortho.

## 2020-04-23 NOTE — Patient Instructions (Signed)
Take Meloxicam once a day in place of Advil or Aleve  Use Cyclobenzaprine at bedtime in place of Robaxin  Use heat or ice to lower back to reduce inflammation  If no improvement, will refer to Orthopedics

## 2020-04-23 NOTE — Progress Notes (Signed)
Date:  04/23/2020   Name:  Christopher Harrington   DOB:  07-23-1981   MRN:  [id number]   Chief Complaint: Annual Exam and Back Pain (started tuesday took muscle relaxers didnt help, lower back and left leg) Christopher Harrington is a 39 y.o. male who presents today for his Complete Annual Exam. He feels fairly well. He reports exercising regularly. He reports he is sleeping fairly well.   Immunization History  Administered Date(s) Administered  . Tdap 03/26/2015    Back Pain This is a new problem. The current episode started in the past 7 days. The problem is unchanged. The pain is present in the lumbar spine. The pain does not radiate. The symptoms are aggravated by standing and twisting. Pertinent negatives include no abdominal pain, chest pain, dysuria or headaches. He has tried muscle relaxant and NSAIDs for the symptoms. The treatment provided mild relief.    Lab Results  Component Value Date   CREATININE 1.0 11/01/2016   BUN 13 11/01/2016   NA 141 11/01/2016   K 4.3 11/01/2016   Lab Results  Component Value Date   CHOL 151 11/01/2016   HDL 43 11/01/2016   LDLCALC 93 11/01/2016   TRIG 75 11/01/2016   No results found for: TSH No results found for: HGBA1C Lab Results  Component Value Date   WBC 6.8 11/01/2016   HGB 14.0 11/01/2016   Lab Results  Component Value Date   ALT 11 11/01/2016   AST 13 (A) 11/01/2016     Review of Systems  Constitutional: Negative for appetite change, chills, diaphoresis, fatigue and unexpected weight change.  HENT: Negative for hearing loss, tinnitus, trouble swallowing and voice change.   Eyes: Negative for visual disturbance.  Respiratory: Negative for choking, shortness of breath and wheezing.   Cardiovascular: Negative for chest pain, palpitations and leg swelling.  Gastrointestinal: Negative for abdominal pain, blood in stool, constipation and diarrhea.  Genitourinary: Negative for difficulty urinating, dysuria and frequency.    Musculoskeletal: Negative for arthralgias, back pain and myalgias.  Skin: Positive for rash. Negative for color change.  Neurological: Negative for dizziness, syncope and headaches.  Hematological: Negative for adenopathy.  Psychiatric/Behavioral: Negative for dysphoric mood and sleep disturbance.    Patient Active Problem List   Diagnosis Date Noted  . Constipation 01/07/2020  . Eustachian tube dysfunction, bilateral 04/01/2019  . Psoriasis 10/16/2017  . History of recurrent ear infection 10/16/2017  . Obesity (BMI 30.0-34.9) 10/25/2016  . Anxiety 03/26/2015  . GERD without esophagitis 03/26/2015    No Known Allergies  History reviewed. No pertinent surgical history.  Social History   Tobacco Use  . Smoking status: Never Smoker  . Smokeless tobacco: Never Used  Substance Use Topics  . Alcohol use: Yes    Comment: socially- once a year  . Drug use: No     Medication list has been reviewed and updated.  Current Meds  Medication Sig  . clobetasol ointment (TEMOVATE) 0.05 % Apply 1 application topically 2 (two) times daily.  . fluticasone (FLONASE) 50 MCG/ACT nasal spray Place daily into both nostrils.  . methocarbamol (ROBAXIN) 500 MG tablet Take 1 tablet (500 mg total) by mouth at bedtime as needed for muscle spasms.  Marland Kitchen UNABLE TO FIND Sea Moss Supplement    [facility] 2/9 Scores 04/23/2020 03/18/2020 01/07/2020 04/01/2019  PHQ - 2 Score 0 0 0 0  PHQ- 9 Score 0 0 4 -    BP Readings from Last 3 Encounters:  04/23/20 132/68  03/18/20 136/70  01/07/20 138/90    Physical Exam Vitals and nursing note reviewed.  Constitutional:      Appearance: Normal appearance. He is well-developed.  HENT:     Head: Normocephalic.     Right Ear: Tympanic membrane, ear canal and external ear normal.     Left Ear: Tympanic membrane, ear canal and external ear normal.     Nose: Nose normal.     Mouth/Throat:     Pharynx: Uvula midline.  Eyes:     Conjunctiva/sclera: Conjunctivae normal.      Pupils: Pupils are equal, round, and reactive to light.  Neck:     Thyroid: No thyromegaly.     Vascular: No carotid bruit.  Cardiovascular:     Rate and Rhythm: Normal rate and regular rhythm.     Heart sounds: Normal heart sounds.  Pulmonary:     Effort: Pulmonary effort is normal.     Breath sounds: Normal breath sounds. No wheezing.  Chest:     Breasts:        Right: No mass.        Left: No mass.  Abdominal:     General: Bowel sounds are normal.     Palpations: Abdomen is soft.     Tenderness: There is no abdominal tenderness.  Musculoskeletal:     Right shoulder: Normal.     Left shoulder: Normal.     Cervical back: Normal range of motion and neck supple. Spasms (on left upper spine and trapezius muscle) present. No tenderness. Normal range of motion.     Lumbar back: Tenderness present. No spasms. Decreased range of motion. Negative right straight leg raise test and negative left straight leg raise test.     Right lower leg: No edema.     Left lower leg: No edema.  Lymphadenopathy:     Cervical: No cervical adenopathy.  Skin:    General: Skin is warm and dry.     Comments: Scattered psoriatic lesions <2 cm on abdomen  Neurological:     Mental Status: He is alert and oriented to person, place, and time.     Deep Tendon Reflexes: Reflexes are normal and symmetric.  Psychiatric:        Attention and Perception: Attention normal.        Mood and Affect: Mood normal.        Speech: Speech normal.        Thought Content: Thought content normal.     Wt Readings from Last 3 Encounters:  04/23/20 199 lb (90.3 kg)  03/18/20 207 lb (93.9 kg)  01/07/20 208 lb (94.3 kg)    BP 132/68   Pulse 67   Temp 97.9 F (36.6 C) (Oral)   Ht 5\' 9"  (1.753 m)   Wt 199 lb (90.3 kg)   SpO2 98%   BMI 29.39 kg/m   Assessment and Plan: 1. Annual physical exam Normal exam Continue healthy diet, exercise - CBC with Differential/Platelet - Comprehensive metabolic panel - Lipid  panel - POCT urinalysis dipstick  2. Psoriasis Controlled with topical agents  3. Encounter for screening for HIV - HIV Antibody (routine testing w rflx)  4. Acute midline low back pain with left-sided sciatica Will try flexeril and mobic; heat or ice If no improvement, refer to [facility] - DG Lumbar Spine Complete; Future - cyclobenzaprine (FLEXERIL) 10 MG tablet; Take 1 tablet (10 mg total) by mouth 3 (three) times daily as needed for muscle  spasms.  Dispense: 30 tablet; Refill: 0 - meloxicam (MOBIC) 15 MG tablet; Take 1 tablet (15 mg total) by mouth daily.  Dispense: 30 tablet; Refill: 0   Partially dictated using Animal nutritionist. Any errors are unintentional.  Bari Edward, MD [facility]  04/23/2020

## 2020-04-24 NOTE — Telephone Encounter (Signed)
Called and left VM informing pt of normal Xray. Told to take meds and call back if no improvement for Orthopedic Referral.   CM

## 2020-05-01 ENCOUNTER — Ambulatory Visit: Payer: Self-pay

## 2020-05-01 DIAGNOSIS — M62838 Other muscle spasm: Secondary | ICD-10-CM | POA: Diagnosis not present

## 2020-05-01 DIAGNOSIS — M542 Cervicalgia: Secondary | ICD-10-CM | POA: Diagnosis not present

## 2020-05-01 NOTE — Telephone Encounter (Signed)
Pt. Reports he saw his PCP 04/23/20 for a physical and was having neck, shoulder and arm pain. Prescribed Flexeril and Mobic. Was feeling better and then last night the pain became constant and medications are not helping. Marchelle Folks in the practice states no availability today. Pt. Instructed to go to UC. Verbalizes understanding. Answer Assessment - Initial Assessment Questions 1. ONSET: "When did the pain begin?"      Started a couple of months ago 2. LOCATION: "Where does it hurt?"      Left side of neck and down left arm 3. PATTERN "Does the pain come and go, or has it been constant since it started?"      Constant now 4. SEVERITY: "How bad is the pain?"  (Scale 1-10; or mild, moderate, severe)   - MILD (1-3): doesn't interfere with normal activities    - MODERATE (4-7): interferes with normal activities or awakens from sleep    - SEVERE (8-10):  excruciating pain, unable to do any normal activities      10 5. RADIATION: "Does the pain go anywhere else, shoot into your arms?"     Yes - arm 6. CORD SYMPTOMS: "Any weakness or numbness of the arms or legs?"     Tingling 7. CAUSE: "What do you think is causing the neck pain?"     Unsure 8. NECK OVERUSE: "Any recent activities that involved turning or twisting the neck?"     No 9. OTHER SYMPTOMS: "Do you have any other symptoms?" (e.g., headache, fever, chest pain, difficulty breathing, neck swelling)     No 10. PREGNANCY: "Is there any chance you are pregnant?" "When was your last menstrual period?"       n/a  Protocols used: NECK PAIN OR STIFFNESS-A-AH

## 2020-05-04 ENCOUNTER — Telehealth: Payer: Self-pay | Admitting: Internal Medicine

## 2020-05-04 NOTE — Telephone Encounter (Signed)
I do not see where he went to UC.  His back xray was essentially normal.  Did he go to an out of network UC?

## 2020-05-04 NOTE — Telephone Encounter (Signed)
Copied from CRM [id number]. Topic: Referral - Request for Referral >> May 04, 2020  9:23 AM Crist Infante wrote: Has patient seen PCP for this complaint? yes Referral for which specialty: whichever dr prefers Preferred provider/office: any Reason for referral: pt has ongoing left shoulder pain.  Pt went to UC on Friday, due to dr not in. At this point, wife states pt needs referral to specialist, because she has seen dr about this issue.  Pt was in pain on Friday, that was only reason for appt.  Please call wife if need tp talk with him about appt.  Pt is at work and cannot hear when he's there.

## 2020-05-04 NOTE — Telephone Encounter (Signed)
Please Advise.  KP

## 2020-05-05 ENCOUNTER — Other Ambulatory Visit: Payer: Self-pay

## 2020-05-05 DIAGNOSIS — G8929 Other chronic pain: Secondary | ICD-10-CM

## 2020-05-05 DIAGNOSIS — M5442 Lumbago with sciatica, left side: Secondary | ICD-10-CM

## 2020-05-05 NOTE — Telephone Encounter (Signed)
Called pt spouse left VM to call back.  KP

## 2020-05-05 NOTE — Telephone Encounter (Signed)
Patient was seen for left lower back/ shoulder pain with sciatica. Still having the same issues. Placed referral to orthopedic surgery for pt. Wife informed.   CM

## 2020-05-08 DIAGNOSIS — S29012A Strain of muscle and tendon of back wall of thorax, initial encounter: Secondary | ICD-10-CM | POA: Diagnosis not present

## 2020-05-08 DIAGNOSIS — M7540 Impingement syndrome of unspecified shoulder: Secondary | ICD-10-CM | POA: Insufficient documentation

## 2020-05-08 DIAGNOSIS — M7542 Impingement syndrome of left shoulder: Secondary | ICD-10-CM | POA: Diagnosis not present

## 2020-05-08 DIAGNOSIS — M5412 Radiculopathy, cervical region: Secondary | ICD-10-CM | POA: Diagnosis not present

## 2020-06-14 ENCOUNTER — Other Ambulatory Visit: Payer: Self-pay | Admitting: Internal Medicine

## 2020-06-14 DIAGNOSIS — L409 Psoriasis, unspecified: Secondary | ICD-10-CM

## 2020-06-14 NOTE — Telephone Encounter (Signed)
Requested Prescriptions  Pending Prescriptions Disp Refills  . clobetasol ointment (TEMOVATE) 0.05 % [Pharmacy Med Name: CLOBETASOL 0.05% OINTMENT] 60 g 5    Sig: APPLY TO AFFECTED AREA TWICE A DAY     Dermatology:  Corticosteroids Passed - 06/14/2020  4:21 PM      Passed - Valid encounter within last 12 months    Recent Outpatient Visits          1 month ago Annual physical exam   [facility] Reubin Milan, MD   2 months ago Bursitis of right hip, unspecified bursa   [facility] Reubin Milan, MD   5 months ago Constipation, unspecified constipation type   [facility] Reubin Milan, MD   1 year ago Psoriasis   [facility] Reubin Milan, MD   1 year ago Hand pain, right   [facility] Reubin Milan, MD      Future Appointments            In 10 months Judithann Graves Nyoka Cowden, MD [facility], [facility]

## 2020-07-21 ENCOUNTER — Encounter: Payer: Self-pay | Admitting: Internal Medicine

## 2020-07-21 DIAGNOSIS — Z20822 Contact with and (suspected) exposure to covid-19: Secondary | ICD-10-CM | POA: Diagnosis not present

## 2020-07-21 DIAGNOSIS — Z03818 Encounter for observation for suspected exposure to other biological agents ruled out: Secondary | ICD-10-CM | POA: Diagnosis not present

## 2020-07-29 ENCOUNTER — Encounter: Payer: Self-pay | Admitting: Internal Medicine

## 2020-09-16 ENCOUNTER — Ambulatory Visit: Payer: 59 | Admitting: Internal Medicine

## 2020-09-16 ENCOUNTER — Other Ambulatory Visit: Payer: Self-pay

## 2020-09-16 ENCOUNTER — Encounter: Payer: Self-pay | Admitting: Internal Medicine

## 2020-09-16 VITALS — BP 122/78 | HR 74 | Temp 98.0°F | Ht 69.0 in | Wt 208.0 lb

## 2020-09-16 DIAGNOSIS — H66001 Acute suppurative otitis media without spontaneous rupture of ear drum, right ear: Secondary | ICD-10-CM

## 2020-09-16 MED ORDER — NEOMYCIN-POLYMYXIN-HC 3.5-10000-1 OT SUSP: 3.0000 [drp] | Freq: Four times a day (QID) | OTIC | 0 refills | Status: DC

## 2020-09-16 MED ORDER — AMOXICILLIN-POT CLAVULANATE 875-125 MG PO TABS: 1.0000 | ORAL_TABLET | Freq: Two times a day (BID) | ORAL | 0 refills | Status: AC

## 2020-09-16 NOTE — Progress Notes (Signed)
Date:  09/16/2020   Name:  Christopher Harrington   DOB:  Apr 30, 1981   MRN:  [id number]   Chief Complaint: Tinnitus [place]ah Flesher to [place] at the end of August. When he got into the water his Rt ear popped. Now his Rt ear is ringing all day long and wax is discolored. Wax smells bad. )  Otalgia  There is pain in the right ear. This is a chronic problem. The current episode started more than 1 month ago. The problem occurs constantly. There has been no fever. The pain is mild. Associated symptoms include ear discharge, hearing loss and rhinorrhea. Pertinent negatives include no coughing, headaches or sore throat.    Lab Results  Component Value Date   CREATININE 1.03 04/23/2020   BUN 14 04/23/2020   NA 136 04/23/2020   K 3.8 04/23/2020   CL 104 04/23/2020   CO2 25 04/23/2020   Lab Results  Component Value Date   CHOL 150 04/23/2020   HDL 49 04/23/2020   LDLCALC 91 04/23/2020   TRIG 51 04/23/2020   CHOLHDL 3.1 04/23/2020   No results found for: TSH No results found for: HGBA1C Lab Results  Component Value Date   WBC 7.6 04/23/2020   HGB 13.8 04/23/2020   HCT 41.1 04/23/2020   MCV 87.4 04/23/2020   PLT 279 04/23/2020   Lab Results  Component Value Date   ALT 26 04/23/2020   AST 19 04/23/2020   ALKPHOS 55 04/23/2020   BILITOT 1.1 04/23/2020     Review of Systems  Constitutional: Negative for chills, fatigue and fever.  HENT: Positive for ear discharge, ear pain, hearing loss, rhinorrhea, sinus pressure and tinnitus. Negative for sore throat and trouble swallowing.   Respiratory: Negative for cough and chest tightness.   Cardiovascular: Negative for chest pain.  Neurological: Negative for dizziness and headaches.    Patient Active Problem List   Diagnosis Date Noted  . Impingement syndrome of shoulder region 05/08/2020  . Constipation 01/07/2020  . Eustachian tube dysfunction, bilateral 04/01/2019  . Psoriasis 10/16/2017  . History of recurrent ear infection  10/16/2017  . Obesity (BMI 30.0-34.9) 10/25/2016  . Anxiety 03/26/2015  . GERD without esophagitis 03/26/2015    No Known Allergies  History reviewed. No pertinent surgical history.  Social History   Tobacco Use  . Smoking status: Never Smoker  . Smokeless tobacco: Never Used  Vaping Use  . Vaping Use: Never used  Substance Use Topics  . Alcohol use: Yes    Comment: socially- once a year  . Drug use: No     Medication list has been reviewed and updated.  Current Meds  Medication Sig  . clobetasol ointment (TEMOVATE) 0.05 % APPLY TO AFFECTED AREA TWICE A DAY  . cyclobenzaprine (FLEXERIL) 10 MG tablet Take 1 tablet (10 mg total) by mouth 3 (three) times daily as needed for muscle spasms.  . fluticasone (FLONASE) 50 MCG/ACT nasal spray Place daily into both nostrils.  . meloxicam (MOBIC) 15 MG tablet Take 1 tablet (15 mg total) by mouth daily.  . Probiotic Product (PROBIOTIC-10) CAPS Take by mouth.  Marland Kitchen UNABLE TO FIND Sea Moss Supplement    [facility] 2/9 Scores 09/16/2020 04/23/2020 03/18/2020 01/07/2020  PHQ - 2 Score 0 0 0 0  PHQ- 9 Score 0 0 0 4    GAD 7 : Generalized Anxiety Score 09/16/2020 04/23/2020  Nervous, Anxious, on Edge 0 0  Control/stop worrying 0 0  Worry too much - different  things 0 0  Trouble relaxing 0 0  Restless 0 0  Easily annoyed or irritable 0 1  Afraid - awful might happen 0 0  Total GAD 7 Score 0 1  Anxiety Difficulty Not difficult at all Not difficult at all    BP Readings from Last 3 Encounters:  09/16/20 122/78  04/23/20 132/68  03/18/20 136/70    Physical Exam Vitals and nursing note reviewed.  Constitutional:      General: He is not in acute distress.    Appearance: Normal appearance. He is well-developed.  HENT:     Head: Normocephalic and atraumatic.     Right Ear: Decreased hearing noted. A middle ear effusion is present.     Left Ear: Hearing, tympanic membrane, ear canal and external ear normal.     Nose:     Right Sinus:  Maxillary sinus tenderness present. No frontal sinus tenderness.     Left Sinus: Maxillary sinus tenderness present. No frontal sinus tenderness.     Mouth/Throat:     Pharynx: No posterior oropharyngeal erythema.  Cardiovascular:     Rate and Rhythm: Normal rate and regular rhythm.  Pulmonary:     Effort: Pulmonary effort is normal. No respiratory distress.     Breath sounds: No wheezing.  Musculoskeletal:        General: Normal range of motion.     Cervical back: Normal range of motion.  Lymphadenopathy:     Cervical: No cervical adenopathy.  Skin:    General: Skin is warm and dry.     Findings: No rash.  Neurological:     Mental Status: He is alert and oriented to person, place, and time.  Psychiatric:        Behavior: Behavior normal.        Thought Content: Thought content normal.     Wt Readings from Last 3 Encounters:  09/16/20 208 lb (94.3 kg)  04/23/20 199 lb (90.3 kg)  03/18/20 207 lb (93.9 kg)    BP 122/78   Pulse 74   Temp 98 F (36.7 C) (Oral)   Ht 5\' 9"  (1.753 m)   Wt 208 lb (94.3 kg)   SpO2 96%   BMI 30.72 kg/m   Assessment and Plan: 1. Non-recurrent acute suppurative otitis media of right ear without spontaneous rupture of tympanic membrane Use Tylenol as needed for pain Use drops for at least 7 days - amoxicillin-clavulanate (AUGMENTIN) 875-125 MG tablet; Take 1 tablet by mouth 2 (two) times daily for 10 days.  Dispense: 20 tablet; Refill: 0 - neomycin-polymyxin-hydrocortisone (CORTISPORIN) 3.5-10000-1 OTIC suspension; Place 3 drops into the right ear 4 (four) times daily.  Dispense: 10 mL; Refill: 0   Partially dictated using 07-24-2003. Any errors are unintentional.  Animal nutritionist, MD [facility]  09/16/2020

## 2020-09-30 ENCOUNTER — Other Ambulatory Visit: Payer: Self-pay

## 2020-09-30 ENCOUNTER — Telehealth: Payer: Self-pay | Admitting: Internal Medicine

## 2020-09-30 DIAGNOSIS — M5442 Lumbago with sciatica, left side: Secondary | ICD-10-CM

## 2020-09-30 NOTE — Telephone Encounter (Signed)
Patient would like a referral to a back specialist.  Is having constant and medication is not effective.  CB# [phone]

## 2020-09-30 NOTE — Telephone Encounter (Signed)
Placed referral for [facility] in [place].

## 2020-10-05 DIAGNOSIS — M5136 Other intervertebral disc degeneration, lumbar region: Secondary | ICD-10-CM | POA: Diagnosis not present

## 2020-10-24 DIAGNOSIS — Z20822 Contact with and (suspected) exposure to covid-19: Secondary | ICD-10-CM | POA: Diagnosis not present

## 2020-10-26 ENCOUNTER — Encounter: Payer: Self-pay | Admitting: Internal Medicine

## 2020-10-28 ENCOUNTER — Encounter: Payer: Self-pay | Admitting: Internal Medicine

## 2020-10-29 ENCOUNTER — Encounter: Payer: Self-pay | Admitting: Internal Medicine

## 2020-10-29 ENCOUNTER — Telehealth: Payer: Self-pay

## 2020-10-29 ENCOUNTER — Ambulatory Visit (INDEPENDENT_AMBULATORY_CARE_PROVIDER_SITE_OTHER): Payer: 59 | Admitting: Internal Medicine

## 2020-10-29 VITALS — Temp 98.5°F | Ht 69.0 in | Wt 208.0 lb

## 2020-10-29 DIAGNOSIS — U071 COVID-19: Secondary | ICD-10-CM

## 2020-10-29 NOTE — Telephone Encounter (Signed)
This visit type is being conducted due to national recommendations for restrictions regarding the COVID- 19 Pandemic (e.g. social distancing) in effort to limit this patients exposure and mitigate transmission in our community. This visit type is felt to be most appropriate for this patient at this time. I connected with the patient today and received telephone consent from the patient and patient understand this consent will be good for 1 year.  KP  

## 2020-10-29 NOTE — Progress Notes (Signed)
Date:  10/29/2020   Name:  Christopher Harrington   DOB:  1980/11/30   MRN:  [id number]   This encounter was conducted via video encounter due to the need for social distancing in light of the Covid-19 pandemic.  The patient was correctly identified. He is located at his home.  I advised that I am conducting the visit from a secure room in my office at [facility] clinic.   The limitations of this form of encounter were discussed with the patient and he/she agreed to proceed.  Some vital signs will be absent.  Chief Complaint: Follow-up (covid f/u pt feels good, has a small cough )  Cough This is a new problem. The current episode started 1 to 4 weeks ago. Pertinent negatives include no chills or fever.  He tested positive for Covid on 10/17/20 due to headache and fever.  Now he has congestion and dry cough. His last day of fever was 10/20/20.  He is much improved at this time and wants to return to work.  He only has a slight non-productive cough.  He completed 10 days of quarantine.  Lab Results  Component Value Date   CREATININE 1.03 04/23/2020   BUN 14 04/23/2020   NA 136 04/23/2020   K 3.8 04/23/2020   CL 104 04/23/2020   CO2 25 04/23/2020   Lab Results  Component Value Date   CHOL 150 04/23/2020   HDL 49 04/23/2020   LDLCALC 91 04/23/2020   TRIG 51 04/23/2020   CHOLHDL 3.1 04/23/2020   No results found for: TSH No results found for: HGBA1C Lab Results  Component Value Date   WBC 7.6 04/23/2020   HGB 13.8 04/23/2020   HCT 41.1 04/23/2020   MCV 87.4 04/23/2020   PLT 279 04/23/2020   Lab Results  Component Value Date   ALT 26 04/23/2020   AST 19 04/23/2020   ALKPHOS 55 04/23/2020   BILITOT 1.1 04/23/2020     Review of Systems  Constitutional: Negative for chills, fatigue and fever.  Respiratory: Positive for cough.     Patient Active Problem List   Diagnosis Date Noted  . Impingement syndrome of shoulder region 05/08/2020  . Constipation 01/07/2020  .  Eustachian tube dysfunction, bilateral 04/01/2019  . Psoriasis 10/16/2017  . History of recurrent ear infection 10/16/2017  . Obesity (BMI 30.0-34.9) 10/25/2016  . Anxiety 03/26/2015  . GERD without esophagitis 03/26/2015    No Known Allergies  History reviewed. No pertinent surgical history.  Social History   Tobacco Use  . Smoking status: Never Smoker  . Smokeless tobacco: Never Used  Vaping Use  . Vaping Use: Never used  Substance Use Topics  . Alcohol use: Yes    Comment: socially- once a year  . Drug use: No     Medication list has been reviewed and updated.  Current Meds  Medication Sig  . clobetasol ointment (TEMOVATE) 0.05 % APPLY TO AFFECTED AREA TWICE A DAY  . cyclobenzaprine (FLEXERIL) 10 MG tablet Take 1 tablet (10 mg total) by mouth 3 (three) times daily as needed for muscle spasms.  . fluticasone (FLONASE) 50 MCG/ACT nasal spray Place daily into both nostrils.  . meloxicam (MOBIC) 15 MG tablet Take 1 tablet (15 mg total) by mouth daily.  Marland Kitchen neomycin-polymyxin-hydrocortisone (CORTISPORIN) 3.5-10000-1 OTIC suspension Place 3 drops into the right ear 4 (four) times daily.  . Probiotic Product (PROBIOTIC-10) CAPS Take by mouth.  Marland Kitchen UNABLE TO FIND Sea Moss Supplement  PHQ 2/9 Scores 10/29/2020 09/16/2020 04/23/2020 03/18/2020  PHQ - 2 Score 0 0 0 0  PHQ- 9 Score 0 0 0 0    GAD 7 : Generalized Anxiety Score 10/29/2020 09/16/2020 04/23/2020  Nervous, Anxious, on Edge 0 0 0  Control/stop worrying 0 0 0  Worry too much - different things 0 0 0  Trouble relaxing 0 0 0  Restless 0 0 0  Easily annoyed or irritable 0 0 1  Afraid - awful might happen 0 0 0  Total GAD 7 Score 0 0 1  Anxiety Difficulty - Not difficult at all Not difficult at all    BP Readings from Last 3 Encounters:  09/16/20 122/78  04/23/20 132/68  03/18/20 136/70    Physical Exam Vitals and nursing note reviewed.  Constitutional:      General: He is not in acute distress.    Appearance:  Normal appearance. He is well-developed.  HENT:     Head: Normocephalic and atraumatic.  Pulmonary:     Effort: Pulmonary effort is normal. No respiratory distress.  Musculoskeletal:        General: Normal range of motion.  Skin:    General: Skin is warm and dry.     Findings: No rash.  Neurological:     Mental Status: He is alert and oriented to person, place, and time.  Psychiatric:        Mood and Affect: Mood normal.        Behavior: Behavior normal.     Wt Readings from Last 3 Encounters:  10/29/20 208 lb (94.3 kg)  09/16/20 208 lb (94.3 kg)  04/23/20 199 lb (90.3 kg)    Temp 98.5 F (36.9 C) (Oral)   Ht 5\' 9"  (1.753 m)   Wt 208 lb (94.3 kg)   BMI 30.72 kg/m   Assessment and Plan: 1. COVID-19 virus infection He is cleared to return to work tomorrow.  His primary symptoms have resolved, no fever for at least 6 days and he has completed 10 days of quarantine. Mild residual cough is not limiting Letter written for his employer.  I spent 10 minutes on this encounter. Partially dictated using . Any errors are unintentional.  Animal nutritionist, MD [facility]  10/29/2020

## 2020-12-07 ENCOUNTER — Encounter: Payer: Self-pay | Admitting: Internal Medicine

## 2020-12-16 ENCOUNTER — Ambulatory Visit: Payer: 59 | Admitting: Internal Medicine

## 2020-12-30 ENCOUNTER — Encounter: Payer: Self-pay | Admitting: Internal Medicine

## 2020-12-30 ENCOUNTER — Other Ambulatory Visit: Payer: Self-pay

## 2020-12-30 ENCOUNTER — Ambulatory Visit: Payer: 59 | Admitting: Internal Medicine

## 2020-12-30 VITALS — BP 114/84 | HR 71 | Temp 98.1°F | Ht 69.0 in | Wt 211.0 lb

## 2020-12-30 DIAGNOSIS — L409 Psoriasis, unspecified: Secondary | ICD-10-CM

## 2020-12-30 DIAGNOSIS — M791 Myalgia, unspecified site: Secondary | ICD-10-CM | POA: Diagnosis not present

## 2020-12-30 DIAGNOSIS — G8929 Other chronic pain: Secondary | ICD-10-CM | POA: Diagnosis not present

## 2020-12-30 DIAGNOSIS — M5432 Sciatica, left side: Secondary | ICD-10-CM

## 2020-12-30 DIAGNOSIS — M79675 Pain in left toe(s): Secondary | ICD-10-CM

## 2020-12-30 MED ORDER — PREDNISONE 10 MG PO TABS: 10.0000 mg | ORAL_TABLET | ORAL | 0 refills | Status: AC

## 2020-12-30 NOTE — Progress Notes (Signed)
Date:  12/30/2020   Name:  Christopher Harrington   DOB:  March 28, 1981   MRN:  [id number]   Chief Complaint: Muscle Pain (X5 months, left side lower back to foot (sciatica), all muscles hurt, constant pain )  Muscle Pain This is a recurrent problem. The problem occurs daily. The pain is present in the left arm and right arm. The pain is mild. The symptoms are aggravated by any movement. Associated symptoms include a rash. Pertinent negatives include no chest pain, fatigue, fever, headaches, joint swelling, shortness of breath or weakness.  Back Pain This is a recurrent problem. The problem occurs daily. The problem is unchanged. The pain is present in the lumbar spine. The pain radiates to the left thigh. The pain is mild. The pain is worse during the day. Associated symptoms include numbness (in left leg). Pertinent negatives include no bladder incontinence, bowel incontinence, chest pain, fever, headaches, leg pain, paresthesias, perianal numbness or weakness. He has tried muscle relaxant and NSAIDs for the symptoms. The treatment provided no relief.  Rash This is a chronic problem. The problem has been waxing and waning since onset. The rash is diffuse. The rash is characterized by scaling, itchiness and dryness (psoriasis). Pertinent negatives include no cough, fatigue, fever or shortness of breath.   He has pain in his left great toe - no redness or swelling.  More painful when walking.  No injury. He also has muscle cramping if he moves his wrists/hands at extremes, also cramping in his neck and tongue if he yawns widely.   No shoulder, elbow, hip knee or ankle pain or swelling. He has persistent psoriasis patches that do not respond to topical steroids.  Lab Results  Component Value Date   CREATININE 1.03 04/23/2020   BUN 14 04/23/2020   NA 136 04/23/2020   K 3.8 04/23/2020   CL 104 04/23/2020   CO2 25 04/23/2020   Lab Results  Component Value Date   CHOL 150 04/23/2020   HDL 49  04/23/2020   LDLCALC 91 04/23/2020   TRIG 51 04/23/2020   CHOLHDL 3.1 04/23/2020   No results found for: TSH No results found for: HGBA1C Lab Results  Component Value Date   WBC 7.6 04/23/2020   HGB 13.8 04/23/2020   HCT 41.1 04/23/2020   MCV 87.4 04/23/2020   PLT 279 04/23/2020   Lab Results  Component Value Date   ALT 26 04/23/2020   AST 19 04/23/2020   ALKPHOS 55 04/23/2020   BILITOT 1.1 04/23/2020     Review of Systems  Constitutional: Negative for chills, fatigue and fever.  Respiratory: Negative for cough, chest tightness and shortness of breath.   Cardiovascular: Negative for chest pain and palpitations.  Gastrointestinal: Negative for bowel incontinence.  Genitourinary: Negative for bladder incontinence.  Musculoskeletal: Positive for back pain, gait problem and myalgias. Negative for joint swelling.  Skin: Positive for rash.  Neurological: Positive for numbness (in left leg). Negative for dizziness, weakness, headaches and paresthesias.    Patient Active Problem List   Diagnosis Date Noted  . Impingement syndrome of shoulder region 05/08/2020  . Constipation 01/07/2020  . Eustachian tube dysfunction, bilateral 04/01/2019  . Psoriasis 10/16/2017  . History of recurrent ear infection 10/16/2017  . Obesity (BMI 30.0-34.9) 10/25/2016  . Anxiety 03/26/2015  . GERD without esophagitis 03/26/2015    No Known Allergies  History reviewed. No pertinent surgical history.  Social History   Tobacco Use  . Smoking status: Never Smoker  .  Smokeless tobacco: Never Used  Vaping Use  . Vaping Use: Never used  Substance Use Topics  . Alcohol use: Yes    Comment: socially- once a year  . Drug use: No     Medication list has been reviewed and updated.  Current Meds  Medication Sig  . clobetasol ointment (TEMOVATE) 0.05 % APPLY TO AFFECTED AREA TWICE A DAY  . fluticasone (FLONASE) 50 MCG/ACT nasal spray Place daily into both nostrils.  Marland Kitchen  neomycin-polymyxin-hydrocortisone (CORTISPORIN) 3.5-10000-1 OTIC suspension Place 3 drops into the right ear 4 (four) times daily.  . predniSONE (DELTASONE) 10 MG tablet Take 1 tablet (10 mg total) by mouth as directed for 6 days. Take 6,5,4,3,2,1 then stop  . Probiotic Product (PROBIOTIC-10) CAPS Take by mouth.  Marland Kitchen UNABLE TO FIND Sea Moss Supplement    [facility] 2/9 Scores 12/30/2020 10/29/2020 09/16/2020 04/23/2020  PHQ - 2 Score 0 0 0 0  PHQ- 9 Score 0 0 0 0    GAD 7 : Generalized Anxiety Score 12/30/2020 10/29/2020 09/16/2020 04/23/2020  Nervous, Anxious, on Edge 0 0 0 0  Control/stop worrying 0 0 0 0  Worry too much - different things 0 0 0 0  Trouble relaxing 0 0 0 0  Restless 0 0 0 0  Easily annoyed or irritable 0 0 0 1  Afraid - awful might happen 0 0 0 0  Total GAD 7 Score 0 0 0 1  Anxiety Difficulty - - Not difficult at all Not difficult at all    BP Readings from Last 3 Encounters:  12/30/20 114/84  09/16/20 122/78  04/23/20 132/68    Physical Exam Vitals and nursing note reviewed.  Constitutional:      General: He is not in acute distress.    Appearance: He is well-developed.  HENT:     Head: Normocephalic and atraumatic.  Cardiovascular:     Rate and Rhythm: Normal rate and regular rhythm.     Pulses: Normal pulses.     Heart sounds: No murmur heard.   Pulmonary:     Effort: Pulmonary effort is normal. No respiratory distress.     Breath sounds: No wheezing or rhonchi.  Musculoskeletal:     Right shoulder: Normal.     Left shoulder: Normal.     Right elbow: Normal.     Left elbow: Normal.     Right wrist: Tenderness present. No swelling. Normal range of motion.     Left wrist: Tenderness present. No swelling. Normal range of motion.     Cervical back: Normal range of motion. No spasms or tenderness.     Lumbar back: No spasms or tenderness. Positive right straight leg raise test and positive left straight leg raise test.     Right lower leg: No edema.     Left  lower leg: No edema.     Comments: Negative Tinel's and Phalen's signs bilaterally  Lymphadenopathy:     Cervical: No cervical adenopathy.  Skin:    General: Skin is warm and dry.     Findings: Rash present.     Comments: Scattered psoriasic plaques noted on lower back  Neurological:     Mental Status: He is alert and oriented to person, place, and time.     Sensory: Sensation is intact.     Motor: Motor function is intact. No weakness or tremor.     Gait: Gait is intact.     Deep Tendon Reflexes:     Reflex Scores:  Bicep reflexes are 1+ on the right side and 1+ on the left side.      Patellar reflexes are 2+ on the right side and 2+ on the left side. Psychiatric:        Attention and Perception: Attention normal.        Mood and Affect: Mood and affect normal.        Behavior: Behavior normal.        Thought Content: Thought content normal.     Wt Readings from Last 3 Encounters:  12/30/20 211 lb (95.7 kg)  10/29/20 208 lb (94.3 kg)  09/16/20 208 lb (94.3 kg)    BP 114/84   Pulse 71   Temp 98.1 F (36.7 C) (Oral)   Ht 5\' 9"  (1.753 m)   Wt 211 lb (95.7 kg)   SpO2 95%   BMI 31.16 kg/m   Assessment and Plan: 1. Sciatica of left side Will treat with steroid taper Previous lumbar films are normal - Comprehensive metabolic panel - predniSONE (DELTASONE) 10 MG tablet; Take 1 tablet (10 mg total) by mouth as directed for 6 days. Take 6,5,4,3,2,1 then stop  Dispense: 21 tablet; Refill: 0  2. Psoriasis - Ambulatory referral to Dermatology  3. Myalgia Metabolic workup  - ANA w/Reflex if Positive - Rheumatoid factor - Sedimentation rate - Magnesium  4. Chronic pain of toe of left foot Rule out gout - Uric acid   Partially dictated using . Any errors are unintentional.  Animal nutritionist, MD [facility]  12/30/2020

## 2020-12-31 LAB — ANA W/REFLEX IF POSITIVE
Anti JO-1: <0.2 AI (ref 0.0–0.9)
Anti Nuclear Antibody (ANA): POSITIVE — AB
Centromere Ab Screen: <0.2 AI (ref 0.0–0.9)
Chromatin Ab SerPl-aCnc: <0.2 AI (ref 0.0–0.9)
ENA RNP Ab: 1.2 AI — ABNORMAL HIGH (ref 0.0–0.9)
ENA SM Ab Ser-aCnc: <0.2 AI (ref 0.0–0.9)
ENA SSA (RO) Ab: <0.2 AI (ref 0.0–0.9)
ENA SSB (LA) Ab: <0.2 AI (ref 0.0–0.9)
Scleroderma (Scl-70) (ENA) Antibody, IgG: <0.2 AI (ref 0.0–0.9)
dsDNA Ab: 6 IU/mL (ref 0–9)

## 2020-12-31 LAB — COMPREHENSIVE METABOLIC PANEL
ALT: 22 IU/L (ref 0–44)
AST: 21 IU/L (ref 0–40)
Albumin/Globulin Ratio: 1.8 (ref 1.2–2.2)
Albumin: 4.8 g/dL (ref 4.0–5.0)
Alkaline Phosphatase: 73 IU/L (ref 44–121)
BUN/Creatinine Ratio: 9 (ref 9–20)
BUN: 10 mg/dL (ref 6–24)
Bilirubin Total: 0.5 mg/dL (ref 0.0–1.2)
CO2: 27 mmol/L (ref 20–29)
Calcium: 9.3 mg/dL (ref 8.7–10.2)
Chloride: 100 mmol/L (ref 96–106)
Creatinine, Ser: 1.12 mg/dL (ref 0.76–1.27)
GFR calc Af Amer: 94 mL/min/{1.73_m2} (ref >59)
GFR calc non Af Amer: 81 mL/min/{1.73_m2} (ref >59)
Globulin, Total: 2.6 g/dL (ref 1.5–4.5)
Glucose: 77 mg/dL (ref 65–99)
Potassium: 4.7 mmol/L (ref 3.5–5.2)
Sodium: 140 mmol/L (ref 134–144)
Total Protein: 7.4 g/dL (ref 6.0–8.5)

## 2020-12-31 LAB — URIC ACID: Uric Acid: 5 mg/dL (ref 3.8–8.4)

## 2020-12-31 LAB — MAGNESIUM: Magnesium: 1.8 mg/dL (ref 1.6–2.3)

## 2020-12-31 LAB — SEDIMENTATION RATE: Sed Rate: 2 mm/hr (ref 0–15)

## 2020-12-31 LAB — RHEUMATOID FACTOR: Rhuematoid fact SerPl-aCnc: <10 IU/mL (ref <14.0)

## 2021-01-01 ENCOUNTER — Encounter: Payer: Self-pay | Admitting: Internal Medicine

## 2021-02-11 ENCOUNTER — Encounter: Payer: Self-pay | Admitting: Internal Medicine

## 2021-04-12 ENCOUNTER — Ambulatory Visit: Payer: 59 | Admitting: Dermatology

## 2021-04-13 ENCOUNTER — Ambulatory Visit: Payer: 59 | Admitting: Dermatology

## 2021-04-26 ENCOUNTER — Encounter: Payer: Self-pay | Admitting: Internal Medicine

## 2021-04-27 ENCOUNTER — Encounter: Payer: 59 | Admitting: Internal Medicine

## 2021-06-01 ENCOUNTER — Ambulatory Visit: Payer: Self-pay | Admitting: *Deleted

## 2021-06-01 NOTE — Telephone Encounter (Signed)
I returned pt's call.  He is c/o right lower abd pain that radiates around to his lower back that is intermittent but getting worse.   Over the past few days the pain has been non stop and getting worse then he'll mention it's intermittent and doesn't hurt al the time.   He wonders if it's from constipation but then he tells me he is going normally every day but then he goes through spells where he doesn't go every day.   Right now he is having daily BMs.    He states the lower right back pain is worse first thing in the mornings but goes away during the day most of the time after stretching.   He stated on occasion he has nausea but no vomiting or diarrhea.  He c/o feeling like he has a lot of gas and is bloated.   "It's just a discomfort".     Due to his work schedule I made him an appt with Dr. Judithann Graves for 06/11/2021 at 10:20.  He works in [employer] and is really busy on his job right now so he is having a hard time getting off of work to come in.   I let him know I would let Dr. Judithann Graves know if something opened up before 7/15, a cancellation or something that we could contact him.   He was agreeable to this plan.     I sent my notes to [facility].

## 2021-06-01 NOTE — Telephone Encounter (Signed)
Please call pt for a same day appt tomorrow or the day after.  KP

## 2021-06-01 NOTE — Telephone Encounter (Signed)
Reason for Disposition  Abdominal pain is a chronic symptom (recurrent or ongoing AND present > 4 weeks)  Answer Assessment - Initial Assessment Questions 1. LOCATION: "Where does it hurt?"      Pain in right lower abd that is intermittent with lower back pain now.   Now the pain is constant 2. RADIATION: "Does the pain shoot anywhere else?" (e.g., chest, back)     Lower right back hurts now and when I first get up in the mornings but it's not constant during the day.  When I stretch out in the morning the back pain goes away.    3. ONSET: "When did the pain begin?" (Minutes, hours or days ago)      Right lower front of abd hurts intermittently.   When I walk around it doesn't usually hurt.   But at times it hurts.   The past few days the pain has been non stop and getting worse 4. SUDDEN: "Gradual or sudden onset?"     It's hard to explain.   I thought it might be constipation but it is on and off.   It's random pain.    I've not been having BMs like I normally do.   I use to go every day but for the past year I don't have a BM every day.   It's intermittent also.    5. PATTERN "Does the pain come and go, or is it constant?"    - If constant: "Is it getting better, staying the same, or worsening?"      (Note: Constant means the pain never goes away completely; most serious pain is constant and it progresses)     - If intermittent: "How long does it last?" "Do you have pain now?"     (Note: Intermittent means the pain goes away completely between bouts)     It's intermittent.    Sometimes I have nausea but no vomiting or diarrhea.   I feel bloated all the time in my abd.   I saw a stomach dr who put me on probiotics.     They didn't really help.    I'm bloated and having the pain in my right lower abd.  It feels a little tight but nothing out of the ordinary for me. 6. SEVERITY: "How bad is the pain?"  (e.g., Scale 1-10; mild, moderate, or severe)    - MILD (1-3): doesn't interfere with normal  activities, abdomen soft and not tender to touch     - MODERATE (4-7): interferes with normal activities or awakens from sleep, abdomen tender to touch     - SEVERE (8-10): excruciating pain, doubled over, unable to do any normal activities       Right discomfort rather than pain.   I can feel something isn't right. 7. RECURRENT SYMPTOM: "Have you ever had this type of stomach pain before?" If Yes, ask: "When was the last time?" and "What happened that time?"      This has been going on for a yr.    The back pain.   When I kneel down and lean forward it feels good in my lower back. 8. CAUSE: "What do you think is causing the stomach pain?"     I'm not sure.   Maybe constipation.  When I push on my abd it feels like I need to pass gas.   I just want to be checked. A very long time a GI dr checked me.  9. RELIEVING/AGGRAVATING FACTORS: "What makes it better or worse?" (e.g., movement, antacids, bowel movement)     I haven't taken anything for these symptoms.    I don't take anything for the back pain because it's intermittent.    The pain is radiating from the front into my back. 10. OTHER SYMPTOMS: "Do you have any other symptoms?" (e.g., back pain, diarrhea, fever, urination pain, vomiting)       I urinate a lot 13-15 times a day is my normal.   I drink 8-9 bottles of water a day.  Protocols used: Abdominal Pain - Male-A-AH

## 2021-06-02 ENCOUNTER — Other Ambulatory Visit: Payer: Self-pay

## 2021-06-02 ENCOUNTER — Encounter: Payer: Self-pay | Admitting: Internal Medicine

## 2021-06-02 ENCOUNTER — Ambulatory Visit: Payer: 59 | Admitting: Internal Medicine

## 2021-06-02 VITALS — BP 124/84 | HR 76 | Ht 69.0 in | Wt 204.0 lb

## 2021-06-02 DIAGNOSIS — M545 Low back pain, unspecified: Secondary | ICD-10-CM | POA: Diagnosis not present

## 2021-06-02 DIAGNOSIS — K5901 Slow transit constipation: Secondary | ICD-10-CM

## 2021-06-02 DIAGNOSIS — G8929 Other chronic pain: Secondary | ICD-10-CM | POA: Diagnosis not present

## 2021-06-02 NOTE — Patient Instructions (Addendum)
Stool softener with vegetable laxative - take one a day.  If stools become too soft or too frequent, then switch to just a stool softener.  Goal is to have 1-2 stools per day.

## 2021-06-02 NOTE — Progress Notes (Signed)
Date:  06/02/2021   Name:  Christopher Harrington   DOB:  March 05, 1981   MRN:  [id number]   Chief Complaint: Abdominal Pain (Ongoing problem, Lower abdominal pain radiating into back getting worse, lower back hurts every morning)  Abdominal Pain This is a new problem. The current episode started 1 to 4 weeks ago. The problem occurs daily. The pain is located in the LLQ and RLQ. Associated symptoms include constipation. Pertinent negatives include no diarrhea, fever, headaches, myalgias, nausea or vomiting. Nothing aggravates the pain. The pain is relieved by Bowel movements and passing flatus. Treatments tried: magnesium citrate or Miralax?  Back Pain This is a recurrent problem. The pain is present in the lumbar spine and sacro-iliac. The quality of the pain is described as aching. The pain does not radiate. The pain is mild. The symptoms are aggravated by twisting and bending. Stiffness is present In the morning. Associated symptoms include abdominal pain. Pertinent negatives include no chest pain, fever or headaches. He has tried NSAIDs for the symptoms. The treatment provided mild relief.   Lab Results  Component Value Date   CREATININE 1.12 12/30/2020   BUN 10 12/30/2020   NA 140 12/30/2020   K 4.7 12/30/2020   CL 100 12/30/2020   CO2 27 12/30/2020   Lab Results  Component Value Date   CHOL 150 04/23/2020   HDL 49 04/23/2020   LDLCALC 91 04/23/2020   TRIG 51 04/23/2020   CHOLHDL 3.1 04/23/2020   No results found for: TSH No results found for: HGBA1C Lab Results  Component Value Date   WBC 7.6 04/23/2020   HGB 13.8 04/23/2020   HCT 41.1 04/23/2020   MCV 87.4 04/23/2020   PLT 279 04/23/2020   Lab Results  Component Value Date   ALT 22 12/30/2020   AST 21 12/30/2020   ALKPHOS 73 12/30/2020   BILITOT 0.5 12/30/2020     Review of Systems  Constitutional:  Negative for chills, fatigue, fever and unexpected weight change.  HENT:  Negative for trouble swallowing.    Respiratory:  Negative for cough, chest tightness and shortness of breath.   Cardiovascular:  Negative for chest pain and leg swelling.  Gastrointestinal:  Positive for abdominal distention, abdominal pain and constipation. Negative for blood in stool, diarrhea, nausea and vomiting.  Genitourinary:  Negative for difficulty urinating.  Musculoskeletal:  Positive for back pain. Negative for gait problem, joint swelling and myalgias.  Neurological:  Negative for dizziness, light-headedness and headaches.  Psychiatric/Behavioral:  Negative for dysphoric mood and sleep disturbance. The patient is not nervous/anxious.    Patient Active Problem List   Diagnosis Date Noted   Impingement syndrome of shoulder region 05/08/2020   Constipation 01/07/2020   Eustachian tube dysfunction, bilateral 04/01/2019   Psoriasis 10/16/2017   History of recurrent ear infection 10/16/2017   Obesity (BMI 30.0-34.9) 10/25/2016   Anxiety 03/26/2015   GERD without esophagitis 03/26/2015    No Known Allergies  History reviewed. No pertinent surgical history.  Social History   Tobacco Use   Smoking status: Never   Smokeless tobacco: Never  Vaping Use   Vaping Use: Never used  Substance Use Topics   Alcohol use: Yes    Comment: socially- once a year   Drug use: No     Medication list has been reviewed and updated.  Current Meds  Medication Sig   clobetasol ointment (TEMOVATE) 0.05 % APPLY TO AFFECTED AREA TWICE A DAY   fluticasone (FLONASE) 50 MCG/ACT  nasal spray Place daily into both nostrils.   [DISCONTINUED] neomycin-polymyxin-hydrocortisone (CORTISPORIN) 3.5-10000-1 OTIC suspension Place 3 drops into the right ear 4 (four) times daily.    PHQ 2/9 Scores 06/02/2021 12/30/2020 10/29/2020 09/16/2020  PHQ - 2 Score 0 0 0 0  PHQ- 9 Score 4 0 0 0    GAD 7 : Generalized Anxiety Score 06/02/2021 12/30/2020 10/29/2020 09/16/2020  Nervous, Anxious, on Edge 1 0 0 0  Control/stop worrying 0 0 0 0  Worry too  much - different things 1 0 0 0  Trouble relaxing 0 0 0 0  Restless 0 0 0 0  Easily annoyed or irritable 2 0 0 0  Afraid - awful might happen 0 0 0 0  Total GAD 7 Score 4 0 0 0  Anxiety Difficulty - - - Not difficult at all    BP Readings from Last 3 Encounters:  06/02/21 124/84  12/30/20 114/84  09/16/20 122/78    Physical Exam Vitals and nursing note reviewed.  Constitutional:      General: He is not in acute distress.    Appearance: He is well-developed.  HENT:     Head: Normocephalic and atraumatic.  Pulmonary:     Effort: Pulmonary effort is normal. No respiratory distress.  Abdominal:     General: Abdomen is protuberant. Bowel sounds are normal. There is no distension or abdominal bruit. There are no signs of injury.     Palpations: Abdomen is soft. There is no shifting dullness, fluid wave, hepatomegaly, splenomegaly or mass.     Tenderness: There is generalized abdominal tenderness. There is no guarding or rebound.     Hernia: No hernia is present.  Musculoskeletal:     Lumbar back: No swelling, edema, spasms or bony tenderness. Negative right straight leg raise test and negative left straight leg raise test.       Back:     Comments: Mild discomfort to palpation - no spasm  Skin:    General: Skin is warm and dry.     Findings: No rash.  Neurological:     Mental Status: He is alert and oriented to person, place, and time.     Deep Tendon Reflexes:     Reflex Scores:      Patellar reflexes are 1+ on the right side and 1+ on the left side. Psychiatric:        Mood and Affect: Mood normal.        Behavior: Behavior normal.    Wt Readings from Last 3 Encounters:  06/02/21 204 lb (92.5 kg)  12/30/20 211 lb (95.7 kg)  10/29/20 208 lb (94.3 kg)    BP 124/84 (BP Location: Right Arm, Patient Position: Sitting, Cuff Size: Large)   Pulse 76   Ht 5\' 9"  (1.753 m)   Wt 204 lb (92.5 kg)   SpO2 95%   BMI 30.13 kg/m   Assessment and Plan: 1. Slow transit  constipation Begin Stool softener with laxative daily; continue to consume adequate fluids daily If stools are too soft or too frequent, switch to plain Colace and continue daily Goal is 1-2 soft stools per day  2. Chronic right-sided low back pain without sciatica Muscular in nature and not related to abdominal complaints Use heat, tylenol as needed   Partially dictated using . Any errors are unintentional.  Animal nutritionist, MD [facility]  06/02/2021

## 2021-06-10 ENCOUNTER — Encounter: Payer: Self-pay | Admitting: Internal Medicine

## 2021-06-11 ENCOUNTER — Ambulatory Visit: Payer: Self-pay | Admitting: Internal Medicine

## 2021-06-11 DIAGNOSIS — Z20822 Contact with and (suspected) exposure to covid-19: Secondary | ICD-10-CM | POA: Diagnosis not present

## 2021-06-16 ENCOUNTER — Telehealth (INDEPENDENT_AMBULATORY_CARE_PROVIDER_SITE_OTHER): Payer: 59 | Admitting: Internal Medicine

## 2021-06-16 ENCOUNTER — Encounter: Payer: Self-pay | Admitting: Internal Medicine

## 2021-06-16 ENCOUNTER — Other Ambulatory Visit: Payer: Self-pay

## 2021-06-16 VITALS — Ht 69.0 in | Wt 204.0 lb

## 2021-06-16 DIAGNOSIS — U071 COVID-19: Secondary | ICD-10-CM | POA: Diagnosis not present

## 2021-06-16 NOTE — Progress Notes (Signed)
Date:  06/16/2021   Name:  Christopher Harrington   DOB:  [birth year]   MRN:  [id number]  This encounter was conducted via video encounter due to the need for social distancing in light of the Covid-19 pandemic.  The patient was correctly identified.  I advised that I am conducting the visit from a secure room in my office at [facility] clinic.  The patient is located at home. The limitations of this form of encounter were discussed with the patient and he/she agreed to proceed.  Some vital signs will be absent.  Chief Complaint: Post Covid (Patient needs a letter to return to work post- covid. No symptoms in the last 3 days. ) Covid positive on 06/10/21 and has been out of work since then.  He feels well and needs a letter to return to work.  This is his second bout with Covid following a single dose of J&J vaccine.  His main sx were head congestion and sore throat and these have resolved.  HPI  Lab Results  Component Value Date   CREATININE 1.12 12/30/2020   BUN 10 12/30/2020   NA 140 12/30/2020   K 4.7 12/30/2020   CL 100 12/30/2020   CO2 27 12/30/2020   Lab Results  Component Value Date   CHOL 150 04/23/2020   HDL 49 04/23/2020   LDLCALC 91 04/23/2020   TRIG 51 04/23/2020   CHOLHDL 3.1 04/23/2020   No results found for: TSH No results found for: HGBA1C Lab Results  Component Value Date   WBC 7.6 04/23/2020   HGB 13.8 04/23/2020   HCT 41.1 04/23/2020   MCV 87.4 04/23/2020   PLT 279 04/23/2020   Lab Results  Component Value Date   ALT 22 12/30/2020   AST 21 12/30/2020   ALKPHOS 73 12/30/2020   BILITOT 0.5 12/30/2020     Review of Systems  Constitutional:  Negative for chills, fatigue and fever.  Respiratory:  Negative for cough, chest tightness and shortness of breath.   Cardiovascular:  Negative for chest pain.  Neurological:  Negative for dizziness and headaches.   Patient Active Problem List   Diagnosis Date Noted   Impingement syndrome of shoulder region  05/08/2020   Constipation 01/07/2020   Eustachian tube dysfunction, bilateral 04/01/2019   Psoriasis 10/16/2017   History of recurrent ear infection 10/16/2017   Obesity (BMI 30.0-34.9) 10/25/2016   Anxiety 03/26/2015   GERD without esophagitis 03/26/2015    No Known Allergies  History reviewed. No pertinent surgical history.  Social History   Tobacco Use   Smoking status: Never   Smokeless tobacco: Never  Vaping Use   Vaping Use: Never used  Substance Use Topics   Alcohol use: Yes    Comment: socially- once a year   Drug use: No     Medication list has been reviewed and updated.  Current Meds  Medication Sig   clobetasol ointment (TEMOVATE) 0.05 % APPLY TO AFFECTED AREA TWICE A DAY   fluticasone (FLONASE) 50 MCG/ACT nasal spray Place daily into both nostrils.    PHQ 2/9 Scores 06/16/2021 06/02/2021 12/30/2020 10/29/2020  PHQ - 2 Score 0 0 0 0  PHQ- 9 Score 4 4 0 0    GAD 7 : Generalized Anxiety Score 06/16/2021 06/02/2021 12/30/2020 10/29/2020  Nervous, Anxious, on Edge 1 1 0 0  Control/stop worrying 0 0 0 0  Worry too much - different things 1 1 0 0  Trouble relaxing 0 0 0  0  Restless 0 0 0 0  Easily annoyed or irritable 2 2 0 0  Afraid - awful might happen 0 0 0 0  Total GAD 7 Score 4 4 0 0  Anxiety Difficulty Not difficult at all - - -    BP Readings from Last 3 Encounters:  06/02/21 124/84  12/30/20 114/84  09/16/20 122/78    Physical Exam Constitutional:      Appearance: Normal appearance.  Pulmonary:     Effort: Pulmonary effort is normal.  Neurological:     Mental Status: He is alert.  Psychiatric:        Mood and Affect: Mood normal.        Behavior: Behavior normal.    Wt Readings from Last 3 Encounters:  06/16/21 204 lb (92.5 kg)  06/02/21 204 lb (92.5 kg)  12/30/20 211 lb (95.7 kg)    Ht 5\' 9"  (1.753 m)   Wt 204 lb (92.5 kg)   BMI 30.13 kg/m   Assessment and Plan: 1. COVID-19 Now recovered without current symptoms He can return to  work tomorrow as it has been 7 days Letter written.  I spent 5 minutes on this encounter. Partially dictated using . Any errors are unintentional.  Animal nutritionist, MD [facility]  06/16/2021

## 2021-06-30 ENCOUNTER — Other Ambulatory Visit: Payer: Self-pay

## 2021-06-30 ENCOUNTER — Ambulatory Visit: Payer: 59 | Admitting: Internal Medicine

## 2021-06-30 ENCOUNTER — Encounter: Payer: Self-pay | Admitting: Internal Medicine

## 2021-06-30 VITALS — BP 130/78 | HR 70 | Ht 69.0 in | Wt 207.0 lb

## 2021-06-30 DIAGNOSIS — L02215 Cutaneous abscess of perineum: Secondary | ICD-10-CM | POA: Diagnosis not present

## 2021-06-30 MED ORDER — AMOXICILLIN-POT CLAVULANATE 875-125 MG PO TABS: 1.0000 | ORAL_TABLET | Freq: Two times a day (BID) | ORAL | 0 refills | Status: AC

## 2021-06-30 NOTE — Progress Notes (Signed)
Date:  06/30/2021   Name:  Christopher Harrington   DOB:  [birth year]   MRN:  [id number]   Chief Complaint: Testicle Pain (Noticed lump on right testicle Saturday when taking a shower. Sunday noticed pain started. Monday took a hot bath, and Tuesday took a alcohol bath, and skin is raw and painful. )  Testicle Pain The patient's primary symptoms include testicular pain. This is a new problem. The current episode started in the past 7 days. The problem occurs constantly. The problem has been rapidly worsening. The pain is moderate. Pertinent negatives include no chills or fever. The testicular pain affects the right testicle. There is swelling in the right testicle. The color of the testicles is Red. The symptoms are aggravated by activity and tactile pressure. He has tried rest for the symptoms. The treatment provided no relief. He is sexually active. He inconsistently uses condoms. No, his partner does not have an STD.   Lab Results  Component Value Date   CREATININE 1.12 12/30/2020   BUN 10 12/30/2020   NA 140 12/30/2020   K 4.7 12/30/2020   CL 100 12/30/2020   CO2 27 12/30/2020   Lab Results  Component Value Date   CHOL 150 04/23/2020   HDL 49 04/23/2020   LDLCALC 91 04/23/2020   TRIG 51 04/23/2020   CHOLHDL 3.1 04/23/2020   No results found for: TSH No results found for: HGBA1C Lab Results  Component Value Date   WBC 7.6 04/23/2020   HGB 13.8 04/23/2020   HCT 41.1 04/23/2020   MCV 87.4 04/23/2020   PLT 279 04/23/2020   Lab Results  Component Value Date   ALT 22 12/30/2020   AST 21 12/30/2020   ALKPHOS 73 12/30/2020   BILITOT 0.5 12/30/2020     Review of Systems  Constitutional:  Negative for chills and fever.  HENT:  Facial swelling: and swelling to the side of his testicle.   Genitourinary:  Positive for testicular pain.   Patient Active Problem List   Diagnosis Date Noted   Impingement syndrome of shoulder region 05/08/2020   Constipation 01/07/2020    Eustachian tube dysfunction, bilateral 04/01/2019   Psoriasis 10/16/2017   History of recurrent ear infection 10/16/2017   Obesity (BMI 30.0-34.9) 10/25/2016   Anxiety 03/26/2015   GERD without esophagitis 03/26/2015    No Known Allergies  History reviewed. No pertinent surgical history.  Social History   Tobacco Use   Smoking status: Never   Smokeless tobacco: Never  Vaping Use   Vaping Use: Never used  Substance Use Topics   Alcohol use: Yes    Comment: socially- once a year   Drug use: No     Medication list has been reviewed and updated.  Current Meds  Medication Sig   clobetasol ointment (TEMOVATE) 0.05 % APPLY TO AFFECTED AREA TWICE A DAY   fluticasone (FLONASE) 50 MCG/ACT nasal spray Place daily into both nostrils.    PHQ 2/9 Scores 06/16/2021 06/02/2021 12/30/2020 10/29/2020  PHQ - 2 Score 0 0 0 0  PHQ- 9 Score 4 4 0 0    GAD 7 : Generalized Anxiety Score 06/16/2021 06/02/2021 12/30/2020 10/29/2020  Nervous, Anxious, on Edge 1 1 0 0  Control/stop worrying 0 0 0 0  Worry too much - different things 1 1 0 0  Trouble relaxing 0 0 0 0  Restless 0 0 0 0  Easily annoyed or irritable 2 2 0 0  Afraid - awful might happen  0 0 0 0  Total GAD 7 Score 4 4 0 0  Anxiety Difficulty Not difficult at all - - -    BP Readings from Last 3 Encounters:  06/30/21 130/78  06/02/21 124/84  12/30/20 114/84    Physical Exam Vitals and nursing note reviewed.  Constitutional:      General: He is not in acute distress.    Appearance: He is well-developed.  HENT:     Head: Normocephalic and atraumatic.  Pulmonary:     Effort: Pulmonary effort is normal. No respiratory distress.  Skin:    General: Skin is warm and dry.     Findings: Abscess (the size of a large grape - in the perineal area on the right.  fluctuant, tender with scant prurulent material matting the hair) present. No rash.  Neurological:     Mental Status: He is alert and oriented to person, place, and time.   Psychiatric:        Mood and Affect: Mood normal.        Behavior: Behavior normal.    Wt Readings from Last 3 Encounters:  06/30/21 207 lb (93.9 kg)  06/16/21 204 lb (92.5 kg)  06/02/21 204 lb (92.5 kg)    BP 130/78 (BP Location: Right Arm, Patient Position: Sitting, Cuff Size: Normal)   Pulse 70   Ht 5\' 9"  (1.753 m)   Wt 207 lb (93.9 kg)   SpO2 96%   BMI 30.57 kg/m   Assessment and Plan: 1. Perineal abscess, superficial Needs I&D Begin antibiotics Referred to General Surgery tomorrow at 3 PM - amoxicillin-clavulanate (AUGMENTIN) 875-125 MG tablet; Take 1 tablet by mouth 2 (two) times daily for 10 days.  Dispense: 20 tablet; Refill: 0   Partially dictated using . Any errors are unintentional.  Animal nutritionist, MD [facility]  06/30/2021

## 2021-07-01 ENCOUNTER — Ambulatory Visit: Payer: 59 | Admitting: General Surgery

## 2021-07-06 ENCOUNTER — Ambulatory Visit: Payer: 59 | Admitting: General Surgery

## 2021-07-12 IMAGING — CR DG LUMBAR SPINE COMPLETE 4+V
5 series · 5 of 5 positions shown · non-contrast
Comparison: None.

CLINICAL DATA: Low back pain with left-sided sciatica

EXAM:
LUMBAR SPINE - COMPLETE 4+ VIEW

[l-spine ap]
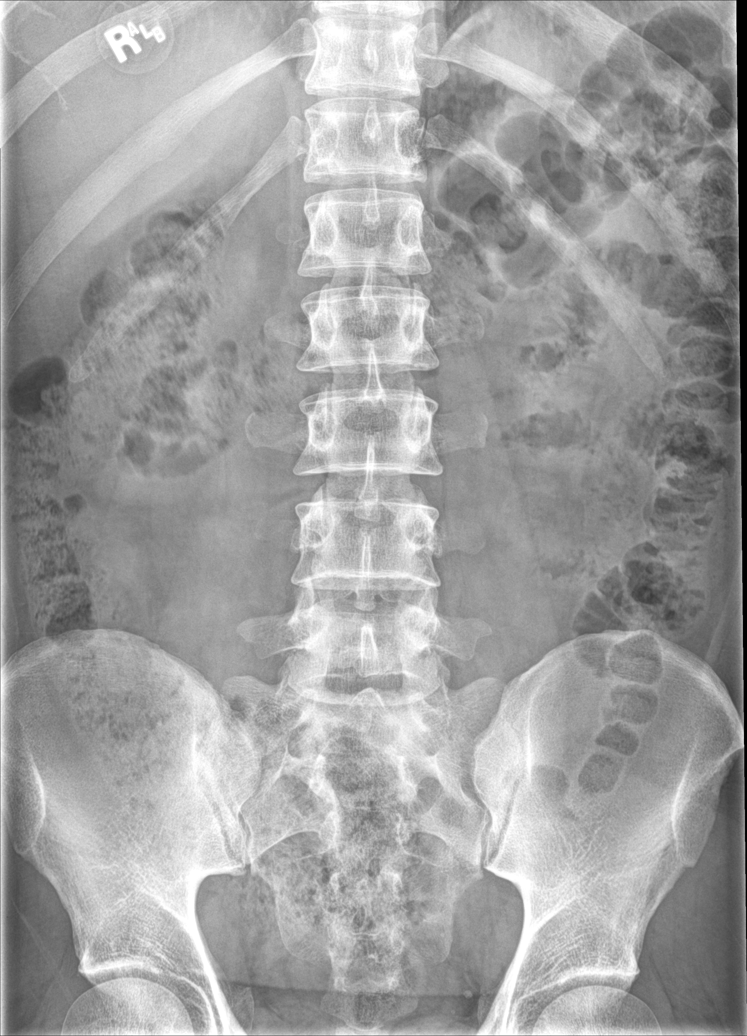

[l-spine obl (1 of 2)]
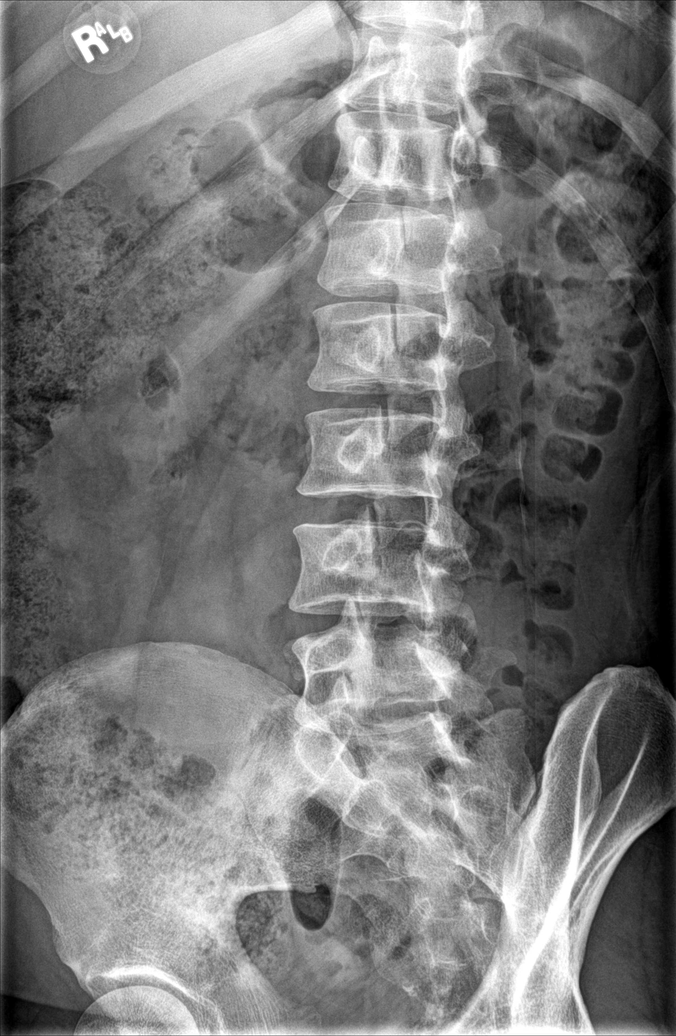

[l-spine obl (2 of 2)]
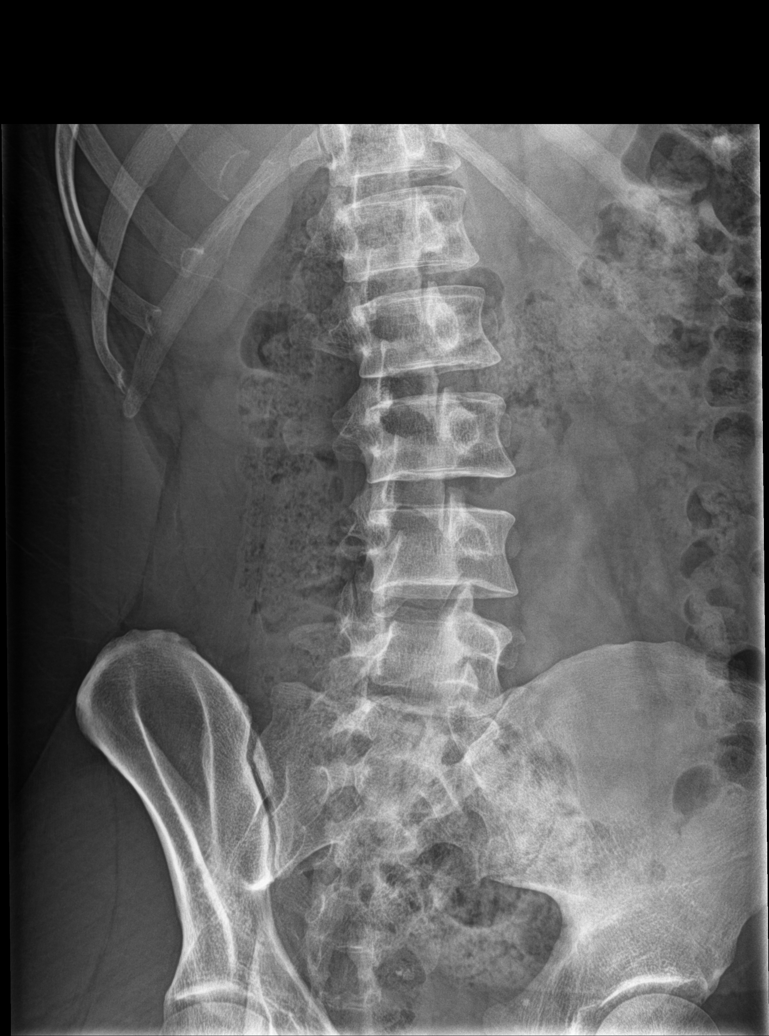

[l-spine lat]
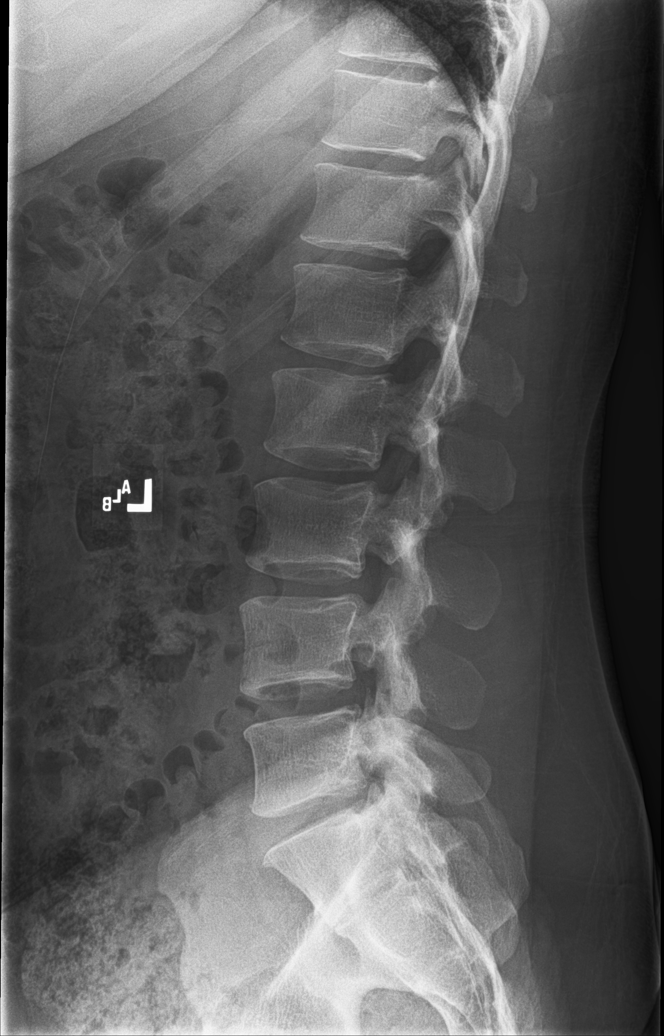

[l-spine spot]
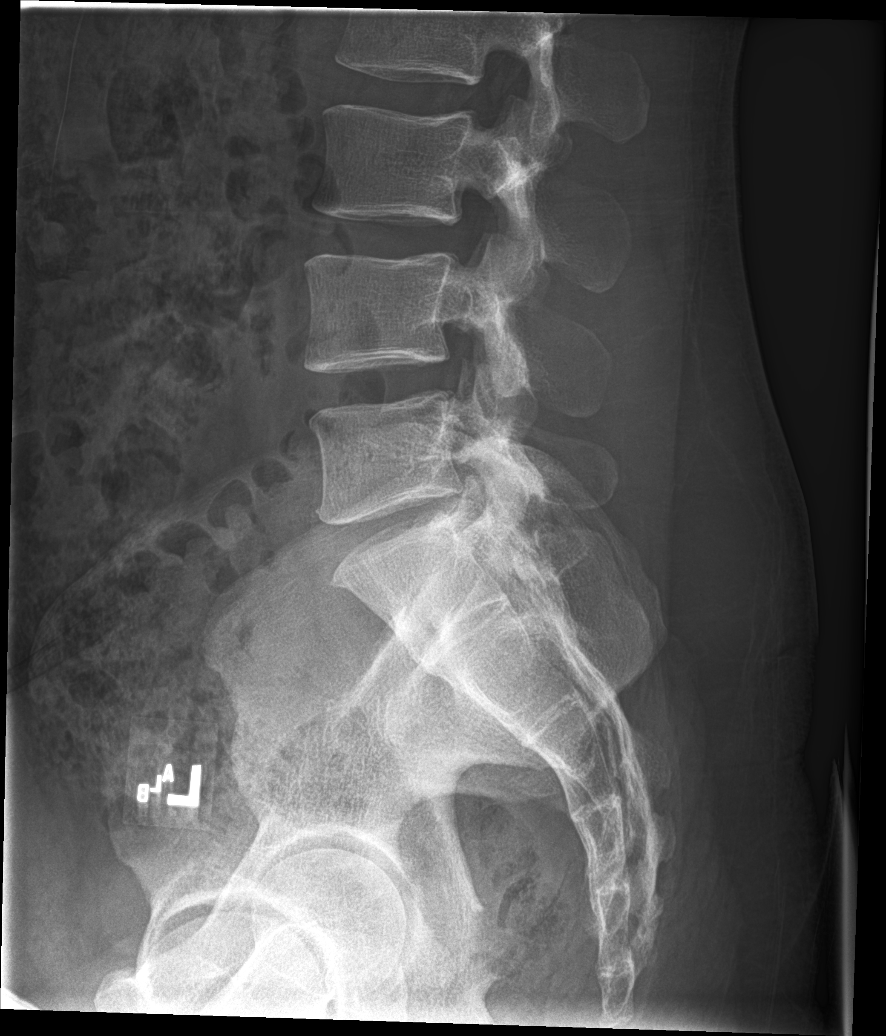

[5 of 5 positions shown; findings below may reference images not displayed]

FINDINGS: Frontal, lateral, spot lumbosacral lateral, and bilateral oblique
views were obtained. There are 5 non-rib-bearing lumbar type
vertebral bodies. There is no fracture or spondylolisthesis. The
disc spaces appear unremarkable. There is no appreciable facet
arthropathy.
IMPRESSION: No fracture or spondylolisthesis.  No evident arthropathic change.

## 2021-08-25 ENCOUNTER — Encounter: Payer: Self-pay | Admitting: General Surgery

## 2021-08-31 ENCOUNTER — Ambulatory Visit: Payer: 59 | Admitting: Internal Medicine

## 2021-08-31 ENCOUNTER — Other Ambulatory Visit: Payer: Self-pay

## 2021-08-31 ENCOUNTER — Encounter: Payer: Self-pay | Admitting: Internal Medicine

## 2021-08-31 VITALS — BP 118/82 | HR 57 | Temp 97.9°F | Ht 69.0 in | Wt 206.0 lb

## 2021-08-31 DIAGNOSIS — J0100 Acute maxillary sinusitis, unspecified: Secondary | ICD-10-CM

## 2021-08-31 DIAGNOSIS — S00411A Abrasion of right ear, initial encounter: Secondary | ICD-10-CM | POA: Diagnosis not present

## 2021-08-31 DIAGNOSIS — M722 Plantar fascial fibromatosis: Secondary | ICD-10-CM | POA: Diagnosis not present

## 2021-08-31 MED ORDER — NEOMYCIN-POLYMYXIN-HC 1 % OT SOLN: 3.0000 [drp] | Freq: Four times a day (QID) | OTIC | 0 refills | Status: DC

## 2021-08-31 MED ORDER — AMOXICILLIN-POT CLAVULANATE 875-125 MG PO TABS: 1.0000 | ORAL_TABLET | Freq: Two times a day (BID) | ORAL | 0 refills | Status: AC

## 2021-08-31 NOTE — Progress Notes (Signed)
e   Date:  08/31/2021   Name:  Christopher Harrington   DOB:  Jun 20, 1981   MRN:  [id number]   Chief Complaint: Ear Drainage (X1 week,right ear draining blood,waking up with dry blood,  pt could have scratched it with finger ), Facial Pain (X1 week, could be do to tooth pain ), and Sinusitis (X1 week, Pressure on right side of nose )  Ear Drainage  There is pain in the right ear. This is a new problem. The current episode started in the past 7 days. The problem has been unchanged. There has been no fever. The patient is experiencing no pain. Associated symptoms include ear discharge. Pertinent negatives include no coughing or headaches. He has tried nothing for the symptoms.  Sinusitis This is a new problem. The current episode started in the past 7 days. There has been no fever. The pain is moderate. Associated symptoms include sinus pressure. Pertinent negatives include no chills, coughing, ear pain, headaches or hoarse voice. Past treatments include nothing.  Foot Injury  There was no injury mechanism. The pain is present in the left foot. The quality of the pain is described as burning and aching. The pain is moderate. The pain has been Fluctuating since onset. Associated symptoms include an inability to bear weight. The symptoms are aggravated by weight bearing. He has tried rest for the symptoms.   Lab Results  Component Value Date   CREATININE 1.12 12/30/2020   BUN 10 12/30/2020   NA 140 12/30/2020   K 4.7 12/30/2020   CL 100 12/30/2020   CO2 27 12/30/2020   Lab Results  Component Value Date   CHOL 150 04/23/2020   HDL 49 04/23/2020   LDLCALC 91 04/23/2020   TRIG 51 04/23/2020   CHOLHDL 3.1 04/23/2020   No results found for: TSH No results found for: HGBA1C Lab Results  Component Value Date   WBC 7.6 04/23/2020   HGB 13.8 04/23/2020   HCT 41.1 04/23/2020   MCV 87.4 04/23/2020   PLT 279 04/23/2020   Lab Results  Component Value Date   ALT 22 12/30/2020   AST 21  12/30/2020   ALKPHOS 73 12/30/2020   BILITOT 0.5 12/30/2020     Review of Systems  Constitutional:  Negative for chills, fatigue and fever.  HENT:  Positive for dental problem, ear discharge and sinus pressure. Negative for ear pain and hoarse voice.   Eyes:  Negative for visual disturbance.  Respiratory:  Negative for cough and chest tightness.   Cardiovascular:  Negative for chest pain.  Musculoskeletal:  Positive for arthralgias and gait problem.  Neurological:  Negative for headaches.   Patient Active Problem List   Diagnosis Date Noted   Impingement syndrome of shoulder region 05/08/2020   Constipation 01/07/2020   Eustachian tube dysfunction, bilateral 04/01/2019   Psoriasis 10/16/2017   History of recurrent ear infection 10/16/2017   Obesity (BMI 30.0-34.9) 10/25/2016   Anxiety 03/26/2015   GERD without esophagitis 03/26/2015    No Known Allergies  History reviewed. No pertinent surgical history.  Social History   Tobacco Use   Smoking status: Never   Smokeless tobacco: Never  Vaping Use   Vaping Use: Never used  Substance Use Topics   Alcohol use: Yes    Comment: socially- once a year   Drug use: No     Medication list has been reviewed and updated.  Current Meds  Medication Sig   clobetasol ointment (TEMOVATE) 0.05 % APPLY TO AFFECTED  AREA TWICE A DAY   fluticasone (FLONASE) 50 MCG/ACT nasal spray Place daily into both nostrils.    PHQ 2/9 Scores 08/31/2021 06/16/2021 06/02/2021 12/30/2020  PHQ - 2 Score 0 0 0 0  PHQ- 9 Score 2 4 4  0    GAD 7 : Generalized Anxiety Score 08/31/2021 06/16/2021 06/02/2021 12/30/2020  Nervous, Anxious, on Edge 0 1 1 0  Control/stop worrying 0 0 0 0  Worry too much - different things 0 1 1 0  Trouble relaxing 0 0 0 0  Restless 0 0 0 0  Easily annoyed or irritable 0 2 2 0  Afraid - awful might happen 0 0 0 0  Total GAD 7 Score 0 4 4 0  Anxiety Difficulty - Not difficult at all - -    BP Readings from Last 3 Encounters:   08/31/21 118/82  06/30/21 130/78  06/02/21 124/84    Physical Exam Constitutional:      Appearance: Normal appearance.  HENT:     Right Ear: Tympanic membrane normal. Right ear drainage: abrasion with bleeding mid canal.     Left Ear: Tympanic membrane normal.     Nose:     Right Sinus: Maxillary sinus tenderness present. No frontal sinus tenderness.     Left Sinus: No maxillary sinus tenderness or frontal sinus tenderness.  Cardiovascular:     Rate and Rhythm: Normal rate and regular rhythm.  Pulmonary:     Effort: Pulmonary effort is normal.     Breath sounds: No wheezing or rhonchi.  Musculoskeletal:     Cervical back: Normal range of motion.     Left foot: Tenderness (over left heel) present. No swelling or deformity.  Lymphadenopathy:     Cervical: No cervical adenopathy.  Neurological:     Mental Status: He is alert.    Wt Readings from Last 3 Encounters:  08/31/21 206 lb (93.4 kg)  06/30/21 207 lb (93.9 kg)  06/16/21 204 lb (92.5 kg)    BP 118/82   Pulse (!) 57   Temp 97.9 F (36.6 C) (Oral)   Ht 5\' 9"  (1.753 m)   Wt 206 lb (93.4 kg)   SpO2 97%   BMI 30.42 kg/m   Assessment and Plan: 1. Ear canal abrasion, right, initial encounter Avoid putting anything other than drops into ear Follow up if no improvement - NEOMYCIN-POLYMYXIN-HYDROCORTISONE (CORTISPORIN) 1 % SOLN OTIC solution; Place 3 drops into the right ear 4 (four) times daily.  Dispense: 10 mL; Refill: 0  2. Acute non-recurrent maxillary sinusitis All needs to see dentist to rule out dental problem contributing to right facial pain - amoxicillin-clavulanate (AUGMENTIN) 875-125 MG tablet; Take 1 tablet by mouth 2 (two) times daily for 10 days.  Dispense: 20 tablet; Refill: 0  3. Plantar fasciitis of left foot Recommend he see Dr. 06/18/21   Partially dictated using Dragon software. Any errors are unintentional.  , MD [facility]  08/31/2021

## 2021-09-01 ENCOUNTER — Other Ambulatory Visit: Payer: Self-pay | Admitting: Internal Medicine

## 2021-09-01 DIAGNOSIS — L409 Psoriasis, unspecified: Secondary | ICD-10-CM

## 2021-09-16 ENCOUNTER — Encounter: Payer: 59 | Admitting: Family Medicine

## 2021-09-16 ENCOUNTER — Ambulatory Visit: Payer: 59 | Admitting: Dermatology

## 2021-09-16 ENCOUNTER — Other Ambulatory Visit: Payer: Self-pay

## 2021-09-16 DIAGNOSIS — L409 Psoriasis, unspecified: Secondary | ICD-10-CM | POA: Diagnosis not present

## 2021-09-16 MED ORDER — VTAMA 1 % EX CREA
1.0000 | TOPICAL_CREAM | Freq: Every day | CUTANEOUS | 4 refills | Status: DC
Start: 2021-09-16 — End: 2023-02-08

## 2021-09-16 NOTE — Patient Instructions (Signed)

## 2021-09-16 NOTE — Progress Notes (Signed)
   New Patient Visit  Subjective  Christopher Harrington is a 40 y.o. male who presents for the following: Psoriasis (Legs, elbows, trunk, not sure how long he has had, clobetasol oint prn).  He would prefer to use topical medications initially at least.  New patient referral from Dr. Bari Edward.  The following portions of the chart were reviewed this encounter and updated as appropriate:   Tobacco  Allergies  Meds  Problems  Med Hx  Surg Hx  Fam Hx     Review of Systems:  No other skin or systemic complaints except as noted in HPI or Assessment and Plan.  Objective  Well appearing patient in no apparent distress; mood and affect are within normal limits.  A focused examination was performed including face, scalp, arms, trunk, legs, buttocks. Relevant physical exam findings are noted in the Assessment and Plan.  bil elbows, scalp, legs, trunk Few guttate spots scalp, abdomen, plaques bil elbows, legs, buttocks                      Assessment & Plan  Psoriasis bil elbows, scalp, legs, trunk  Psoriasis is a chronic non-curable, but treatable genetic/hereditary disease that may have other systemic features affecting other organ systems such as joints (Psoriatic Arthritis). It is associated with an increased risk of inflammatory bowel disease, heart disease, non-alcoholic fatty liver disease, and depression.    BSA 11%  Discussed treatment options topical treatment, oral treatment, and biologics  Pt declines oral and injectable treatment at this time  Start Vtama qd to aa psoriasis dsp 60g, 4rf, sample given [facility] 11/23 May consider topical Zoryve in future.  Related Medications clobetasol ointment (TEMOVATE) 0.05 % APPLY TO AFFECTED AREA TWICE A DAY  Tapinarof (VTAMA) 1 % CREA Apply 1 application topically daily. Qd to aa psoriasis on body and in scalp  Return for 2-4 months for psoriasis.  I, Ardis Rowan, RMA, am acting as scribe for Armida Sans, MD . Documentation: I have reviewed the above documentation for accuracy and completeness, and I agree with the above.  Armida Sans, MD

## 2021-09-21 ENCOUNTER — Encounter: Payer: Self-pay | Admitting: Dermatology

## 2021-10-19 DIAGNOSIS — Z20822 Contact with and (suspected) exposure to covid-19: Secondary | ICD-10-CM | POA: Diagnosis not present

## 2021-11-14 ENCOUNTER — Encounter: Payer: Self-pay | Admitting: Emergency Medicine

## 2021-11-14 DIAGNOSIS — K0889 Other specified disorders of teeth and supporting structures: Secondary | ICD-10-CM | POA: Insufficient documentation

## 2021-11-14 MED ORDER — OXYCODONE-ACETAMINOPHEN 5-325 MG PO TABS
1.0000 | ORAL_TABLET | Freq: Once | ORAL | Status: AC
  Administered 2021-11-15: 1 via ORAL
  Filled 2021-11-14: qty 1

## 2021-11-14 MED ORDER — OXYCODONE-ACETAMINOPHEN 5-325 MG PO TABS: 1.0000 | ORAL_TABLET | ORAL | 0 refills | Status: DC | PRN

## 2021-11-14 NOTE — Discharge Instructions (Signed)
You may take Percocet in addition to Ibuprofen to help tooth ache.  Return to the ER for worsening symptoms, persistent vomiting, facial swelling, fever or other concerns.

## 2021-11-14 NOTE — ED Triage Notes (Signed)
Pt c/o right upper wisdom tooth pain. Pt due for tooth removal on 01/03 but unable to tolerate pain with OTC meds. Per pt, he is awaiting call back for sooner removal this week. Pt denies fever. Pt last took Ibuprofen 45 mins prior to arrival.
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
## 2021-11-15 ENCOUNTER — Emergency Department
Admission: EM | Admit: 2021-11-15 | Discharge: 2021-11-15 | Disposition: A | Discharge status: Home / Self Care | Payer: 59 | Attending: Emergency Medicine | Admitting: Emergency Medicine

## 2021-11-15 DIAGNOSIS — K0889 Other specified disorders of teeth and supporting structures: Secondary | ICD-10-CM

## 2021-12-27 ENCOUNTER — Other Ambulatory Visit: Payer: Self-pay | Admitting: Internal Medicine

## 2021-12-27 DIAGNOSIS — L409 Psoriasis, unspecified: Secondary | ICD-10-CM

## 2021-12-28 NOTE — Telephone Encounter (Signed)
Requested Prescriptions  Pending Prescriptions Disp Refills   clobetasol ointment (TEMOVATE) 0.05 % [Pharmacy Med Name: CLOBETASOL 0.05% OINTMENT] 60 g 0    Sig: APPLY TO AFFECTED AREA TWICE A DAY     Dermatology:  Corticosteroids Passed - 12/27/2021  9:38 PM      Passed - Valid encounter within last 12 months    Recent Outpatient Visits          3 months ago Ear canal abrasion, right, initial encounter   [facility] Reubin Milan, MD   6 months ago Perineal abscess, superficial   [facility] Reubin Milan, MD   6 months ago COVID-19   [facility] Reubin Milan, MD   6 months ago Slow transit constipation   [facility] Reubin Milan, MD   12 months ago Sciatica of left side   [facility] Reubin Milan, MD      Future Appointments            In 6 days Deirdre Evener, MD [facility]

## 2022-01-03 ENCOUNTER — Ambulatory Visit: Payer: 59 | Admitting: Dermatology

## 2022-02-01 ENCOUNTER — Encounter: Payer: Self-pay | Admitting: Internal Medicine

## 2022-02-01 ENCOUNTER — Ambulatory Visit: Payer: 59 | Admitting: Internal Medicine

## 2022-02-01 ENCOUNTER — Other Ambulatory Visit: Payer: Self-pay

## 2022-02-01 VITALS — BP 126/76 | HR 65 | Resp 12 | Ht 69.0 in | Wt 209.0 lb

## 2022-02-01 DIAGNOSIS — R42 Dizziness and giddiness: Secondary | ICD-10-CM

## 2022-02-01 DIAGNOSIS — H9202 Otalgia, left ear: Secondary | ICD-10-CM

## 2022-02-01 MED ORDER — AZITHROMYCIN 250 MG PO TABS
ORAL_TABLET | ORAL | 0 refills | Status: AC
Start: 2022-02-01 — End: 2022-02-06

## 2022-02-01 MED ORDER — PREDNISONE 10 MG PO TABS
10.0000 mg | ORAL_TABLET | ORAL | 0 refills | Status: AC
Start: 2022-02-01 — End: 2022-02-07

## 2022-02-01 NOTE — Progress Notes (Signed)
? ? ?Date:  02/01/2022  ? ?Name:  Christopher Harrington   DOB:  06/29/1981   MRN:  [id number] ? ? ?Chief Complaint: Ear Pain ? ?Otalgia  ?There is pain in the left ear. This is a new problem. The current episode started in the past 7 days. The problem occurs constantly. There has been no fever. The pain is moderate. Pertinent negatives include no headaches or vomiting. Associated symptoms comments: Vertigo.  ?Dizziness ?This is a new problem. The current episode started today. The problem occurs intermittently. The problem has been waxing and waning. Associated symptoms include nausea and vertigo. Pertinent negatives include no chest pain, chills, fatigue, fever, headaches or vomiting. The symptoms are aggravated by walking, standing and eating. He has tried nothing for the symptoms.  ? ?Lab Results  ?Component Value Date  ? NA 140 12/30/2020  ? K 4.7 12/30/2020  ? CO2 27 12/30/2020  ? GLUCOSE 77 12/30/2020  ? BUN 10 12/30/2020  ? CREATININE 1.12 12/30/2020  ? CALCIUM 9.3 12/30/2020  ? GFRNONAA 81 12/30/2020  ? ?Lab Results  ?Component Value Date  ? CHOL 150 04/23/2020  ? HDL 49 04/23/2020  ? LDLCALC 91 04/23/2020  ? TRIG 51 04/23/2020  ? CHOLHDL 3.1 04/23/2020  ? ?No results found for: TSH ?No results found for: HGBA1C ?Lab Results  ?Component Value Date  ? WBC 7.6 04/23/2020  ? HGB 13.8 04/23/2020  ? HCT 41.1 04/23/2020  ? MCV 87.4 04/23/2020  ? PLT 279 04/23/2020  ? ?Lab Results  ?Component Value Date  ? ALT 22 12/30/2020  ? AST 21 12/30/2020  ? ALKPHOS 73 12/30/2020  ? BILITOT 0.5 12/30/2020  ? ?No results found for: 25OHVITD2, 25OHVITD3, VD25OH  ? ?Review of Systems  ?Constitutional:  Negative for chills, fatigue and fever.  ?HENT:  Positive for ear pain and postnasal drip. Negative for sinus pressure and trouble swallowing.   ?Cardiovascular:  Negative for chest pain and palpitations.  ?Gastrointestinal:  Positive for nausea. Negative for vomiting.  ?Neurological:  Positive for dizziness and vertigo. Negative for  light-headedness and headaches.  ?Psychiatric/Behavioral:  Negative for dysphoric mood and sleep disturbance. The patient is not nervous/anxious.   ? ?Patient Active Problem List  ? Diagnosis Date Noted  ? Impingement syndrome of shoulder region 05/08/2020  ? Constipation 01/07/2020  ? Eustachian tube dysfunction, bilateral 04/01/2019  ? Psoriasis 10/16/2017  ? History of recurrent ear infection 10/16/2017  ? Obesity (BMI 30.0-34.9) 10/25/2016  ? Anxiety 03/26/2015  ? GERD without esophagitis 03/26/2015  ? ? ?No Known Allergies ? ?History reviewed. No pertinent surgical history. ? ?Social History  ? ?Tobacco Use  ? Smoking status: Never  ? Smokeless tobacco: Never  ?Vaping Use  ? Vaping Use: Never used  ?Substance Use Topics  ? Alcohol use: Yes  ?  Comment: socially- once a year  ? Drug use: No  ? ? ? ?Medication list has been reviewed and updated. ? ?Current Meds  ?Medication Sig  ? clobetasol ointment (TEMOVATE) 0.05 % APPLY TO AFFECTED AREA TWICE A DAY  ? fluticasone (FLONASE) 50 MCG/ACT nasal spray Place daily into both nostrils.  ? NEOMYCIN-POLYMYXIN-HYDROCORTISONE (CORTISPORIN) 1 % SOLN OTIC solution Place 3 drops into the right ear 4 (four) times daily.  ? Tapinarof (VTAMA) 1 % CREA Apply 1 application topically daily. Qd to aa psoriasis on body and in scalp  ? ? ?PHQ 2/9 Scores 02/01/2022 08/31/2021 06/16/2021 06/02/2021  ?PHQ - 2 Score 0 0 0 0  ?PHQ- 9  Score 0 2 4 4   ? ? ?GAD 7 : Generalized Anxiety Score 02/01/2022 08/31/2021 06/16/2021 06/02/2021  ?Nervous, Anxious, on Edge 0 0 1 1  ?Control/stop worrying 0 0 0 0  ?Worry too much - different things 0 0 1 1  ?Trouble relaxing 0 0 0 0  ?Restless 0 0 0 0  ?Easily annoyed or irritable 0 0 2 2  ?Afraid - awful might happen 0 0 0 0  ?Total GAD 7 Score 0 0 4 4  ?Anxiety Difficulty Not difficult at all - Not difficult at all -  ? ? ?BP Readings from Last 3 Encounters:  ?02/01/22 126/76  ?11/14/21 (!) 150/88  ?08/31/21 118/82  ? ? ?Physical Exam ?Constitutional:   ?    Appearance: Normal appearance. He is well-developed.  ?HENT:  ?   Right Ear: Ear canal and external ear normal. No middle ear effusion. Tympanic membrane is not erythematous (TM dull) or retracted.  ?   Left Ear: Ear canal and external ear normal. Tympanic membrane is retracted. Tympanic membrane is not erythematous (TM shiny).  ?   Nose:  ?   Right Sinus: Maxillary sinus tenderness present. No frontal sinus tenderness.  ?   Left Sinus: Maxillary sinus tenderness present. No frontal sinus tenderness.  ?   Mouth/Throat:  ?   Mouth: No oral lesions.  ?   Pharynx: Uvula midline. Posterior oropharyngeal erythema present. No oropharyngeal exudate.  ?Eyes:  ?   Extraocular Movements: Extraocular movements intact.  ?   Right eye: Normal extraocular motion and no nystagmus.  ?   Left eye: Normal extraocular motion and no nystagmus.  ?Cardiovascular:  ?   Rate and Rhythm: Normal rate and regular rhythm.  ?   Heart sounds: Normal heart sounds.  ?Pulmonary:  ?   Breath sounds: Normal breath sounds. No wheezing or rales.  ?Musculoskeletal:  ?   Cervical back: Normal range of motion.  ?Lymphadenopathy:  ?   Cervical: No cervical adenopathy.  ?Neurological:  ?   Mental Status: He is alert and oriented to person, place, and time.  ? ? ?Wt Readings from Last 3 Encounters:  ?02/01/22 209 lb (94.8 kg)  ?08/31/21 206 lb (93.4 kg)  ?06/30/21 207 lb (93.9 kg)  ? ? ?BP 126/76   Pulse 65   Resp 12   Ht 5\' 9"  (1.753 m)   Wt 209 lb (94.8 kg)   SpO2 98%   BMI 30.86 kg/m?  ? ?Assessment and Plan: ?1. Dizziness ?Due to inner ear congestion/chronic eustachian tube dysfunction. Continue flonase spray ?If recurrent, recommend OTC Dramamine ?- predniSONE (DELTASONE) 10 MG tablet; Take 1 tablet (10 mg total) by mouth as directed for 6 days. Take 6,5,4,3,2,1 then stop  Dispense: 21 tablet; Refill: 0 ?- Comprehensive metabolic panel ? ?2. Left ear pain ?Early OM on exam ?- azithromycin (ZITHROMAX Z-PAK) 250 MG tablet; UAD  Dispense: 6 each;  Refill: 0 ?- CBC with Differential/Platelet ? ? ?Partially dictated using 08/30/21. Any errors are unintentional. ? ? , MD ?[facility] ?[facility] ? ?02/01/2022 ? ? ? ? ? ?

## 2022-02-02 ENCOUNTER — Encounter: Payer: Self-pay | Admitting: Internal Medicine

## 2022-02-02 ENCOUNTER — Other Ambulatory Visit
Admission: RE | Admit: 2022-02-02 | Discharge: 2022-02-02 | Disposition: A | Discharge status: Home / Self Care | Payer: 59 | Attending: Internal Medicine | Admitting: Internal Medicine

## 2022-02-02 DIAGNOSIS — R42 Dizziness and giddiness: Secondary | ICD-10-CM | POA: Insufficient documentation

## 2022-02-02 DIAGNOSIS — H9202 Otalgia, left ear: Secondary | ICD-10-CM | POA: Diagnosis not present

## 2022-02-02 LAB — CBC WITH DIFFERENTIAL/PLATELET
Abs Immature Granulocytes: 0.05 10*3/uL (ref 0.00–0.07)
Basophils Absolute: 0 10*3/uL (ref 0.0–0.1)
Basophils Relative: 0 %
Eosinophils Absolute: 0 10*3/uL (ref 0.0–0.5)
Eosinophils Relative: 0 %
HCT: 43.5 % (ref 39.0–52.0)
Hemoglobin: 15.3 g/dL (ref 13.0–17.0)
Immature Granulocytes: 0 %
Lymphocytes Relative: 7 %
Lymphs Abs: 1 10*3/uL (ref 0.7–4.0)
MCH: 30.4 pg (ref 26.0–34.0)
MCHC: 35.2 g/dL (ref 30.0–36.0)
MCV: 86.5 fL (ref 80.0–100.0)
Monocytes Absolute: 0.4 10*3/uL (ref 0.1–1.0)
Monocytes Relative: 3 %
Neutro Abs: 11.6 10*3/uL — ABNORMAL HIGH (ref 1.7–7.7)
Neutrophils Relative %: 90 %
Platelets: 305 10*3/uL (ref 150–400)
RBC: 5.03 MIL/uL (ref 4.22–5.81)
RDW: 13.2 % (ref 11.5–15.5)
WBC: 13.1 10*3/uL — ABNORMAL HIGH (ref 4.0–10.5)
nRBC: 0 % (ref 0.0–0.2)

## 2022-02-02 LAB — COMPREHENSIVE METABOLIC PANEL
ALT: 17 U/L (ref 0–44)
AST: 17 U/L (ref 15–41)
Albumin: 4.7 g/dL (ref 3.5–5.0)
Alkaline Phosphatase: 71 U/L (ref 38–126)
Anion gap: 9 (ref 5–15)
BUN: 19 mg/dL (ref 6–20)
CO2: 26 mmol/L (ref 22–32)
Calcium: 9.3 mg/dL (ref 8.9–10.3)
Chloride: 96 mmol/L — ABNORMAL LOW (ref 98–111)
Creatinine, Ser: 1.03 mg/dL (ref 0.61–1.24)
GFR, Estimated: >60 mL/min (ref >60)
Glucose, Bld: 120 mg/dL — ABNORMAL HIGH (ref 70–99)
Potassium: 4 mmol/L (ref 3.5–5.1)
Sodium: 131 mmol/L — ABNORMAL LOW (ref 135–145)
Total Bilirubin: 0.8 mg/dL (ref 0.3–1.2)
Total Protein: 8.3 g/dL — ABNORMAL HIGH (ref 6.5–8.1)

## 2022-02-03 ENCOUNTER — Other Ambulatory Visit: Payer: Self-pay

## 2022-02-03 DIAGNOSIS — R7309 Other abnormal glucose: Secondary | ICD-10-CM

## 2022-02-03 NOTE — Progress Notes (Signed)
error 

## 2022-02-04 ENCOUNTER — Other Ambulatory Visit: Payer: Self-pay

## 2022-02-04 ENCOUNTER — Inpatient Hospital Stay: Admission: RE | Admit: 2022-02-04 | Payer: 59 | Source: Home / Self Care

## 2022-02-04 DIAGNOSIS — R7309 Other abnormal glucose: Secondary | ICD-10-CM | POA: Diagnosis not present

## 2022-02-05 LAB — HEMOGLOBIN A1C
Est. average glucose Bld gHb Est-mCnc: 111 mg/dL
Hgb A1c MFr Bld: 5.5 % (ref 4.8–5.6)

## 2022-03-24 ENCOUNTER — Encounter: Payer: Self-pay | Admitting: Dermatology

## 2022-05-05 ENCOUNTER — Encounter: Payer: Self-pay | Admitting: Internal Medicine

## 2022-05-05 ENCOUNTER — Ambulatory Visit: Payer: 59 | Admitting: Internal Medicine

## 2022-05-05 VITALS — BP 106/84 | HR 79 | Temp 98.0°F | Ht 69.0 in | Wt 205.0 lb

## 2022-05-05 DIAGNOSIS — J0100 Acute maxillary sinusitis, unspecified: Secondary | ICD-10-CM

## 2022-05-05 MED ORDER — AMOXICILLIN-POT CLAVULANATE 875-125 MG PO TABS: 1.0000 | ORAL_TABLET | Freq: Two times a day (BID) | ORAL | 0 refills | Status: AC

## 2022-05-05 MED ORDER — PROMETHAZINE-DM 6.25-15 MG/5ML PO SYRP: 5.0000 mL | ORAL_SOLUTION | Freq: Four times a day (QID) | ORAL | 0 refills | Status: AC | PRN

## 2022-05-05 NOTE — Patient Instructions (Addendum)
Coricidin HBP - take as directed for congestion 

## 2022-05-05 NOTE — Progress Notes (Signed)
Date:  05/05/2022   Name:  Christopher Harrington   DOB:  28-Apr-1981   MRN:  [id number]   Chief Complaint: Sinusitis  Sinusitis This is a new problem. Episode onset: 3 weeks. The problem has been gradually worsening since onset. His pain is at a severity of 0/10. He is experiencing no pain. Associated symptoms include chills, congestion, coughing, ear pain, headaches, neck pain, sinus pressure, sneezing and a sore throat. Past treatments include nasal decongestants and oral decongestants. The treatment provided no relief.    Lab Results  Component Value Date   NA 131 (L) 02/02/2022   K 4.0 02/02/2022   CO2 26 02/02/2022   GLUCOSE 120 (H) 02/02/2022   BUN 19 02/02/2022   CREATININE 1.03 02/02/2022   CALCIUM 9.3 02/02/2022   GFRNONAA >60 02/02/2022   Lab Results  Component Value Date   CHOL 150 04/23/2020   HDL 49 04/23/2020   LDLCALC 91 04/23/2020   TRIG 51 04/23/2020   CHOLHDL 3.1 04/23/2020   No results found for: "TSH" Lab Results  Component Value Date   HGBA1C 5.5 02/04/2022   Lab Results  Component Value Date   WBC 13.1 (H) 02/02/2022   HGB 15.3 02/02/2022   HCT 43.5 02/02/2022   MCV 86.5 02/02/2022   PLT 305 02/02/2022   Lab Results  Component Value Date   ALT 17 02/02/2022   AST 17 02/02/2022   ALKPHOS 71 02/02/2022   BILITOT 0.8 02/02/2022   No results found for: "25OHVITD2", "25OHVITD3", "VD25OH"   Review of Systems  Constitutional:  Positive for chills. Negative for fatigue and fever.  HENT:  Positive for congestion, ear pain, sinus pressure, sneezing and sore throat.   Respiratory:  Positive for cough. Negative for chest tightness and wheezing.   Cardiovascular:  Negative for chest pain and palpitations.  Musculoskeletal:  Positive for neck pain.  Neurological:  Positive for headaches. Negative for dizziness.    Patient Active Problem List   Diagnosis Date Noted   Impingement syndrome of shoulder region 05/08/2020   Constipation 01/07/2020    Eustachian tube dysfunction, bilateral 04/01/2019   Psoriasis 10/16/2017   History of recurrent ear infection 10/16/2017   Obesity (BMI 30.0-34.9) 10/25/2016   Anxiety 03/26/2015   GERD without esophagitis 03/26/2015    No Known Allergies  History reviewed. No pertinent surgical history.  Social History   Tobacco Use   Smoking status: Never   Smokeless tobacco: Never  Vaping Use   Vaping Use: Never used  Substance Use Topics   Alcohol use: Yes    Comment: socially- once a year   Drug use: No     Medication list has been reviewed and updated.  Current Meds  Medication Sig   clobetasol ointment (TEMOVATE) 0.05 % APPLY TO AFFECTED AREA TWICE A DAY   fluticasone (FLONASE) 50 MCG/ACT nasal spray Place daily into both nostrils.   Tapinarof (VTAMA) 1 % CREA Apply 1 application topically daily. Qd to aa psoriasis on body and in scalp   UNABLE TO FIND Med Name: [facility] OTC       05/05/2022   11:09 AM 02/01/2022    3:48 PM 08/31/2021    9:08 AM 06/16/2021   10:47 AM  GAD 7 : Generalized Anxiety Score  Nervous, Anxious, on Edge 1 0 0 1  Control/stop worrying 1 0 0 0  Worry too much - different things 1 0 0 1  Trouble relaxing 1 0 0 0  Restless 0  0 0 0  Easily annoyed or irritable 2 0 0 2  Afraid - awful might happen 0 0 0 0  Total GAD 7 Score 6 0 0 4  Anxiety Difficulty Not difficult at all Not difficult at all  Not difficult at all       05/05/2022   11:09 AM  Depression screen PHQ 2/9  Decreased Interest 1  Down, Depressed, Hopeless 1  PHQ - 2 Score 2  Altered sleeping 1  Tired, decreased energy 1  Change in appetite 1  Feeling bad or failure about yourself  0  Trouble concentrating 0  Moving slowly or fidgety/restless 0  Suicidal thoughts 0  PHQ-9 Score 5  Difficult doing work/chores Not difficult at all    BP Readings from Last 3 Encounters:  05/05/22 106/84  02/01/22 126/76  11/14/21 (!) 150/88    Physical Exam Vitals and nursing note reviewed.   Constitutional:      General: He is not in acute distress.    Appearance: Normal appearance. He is well-developed.  HENT:     Head: Normocephalic and atraumatic.     Right Ear: No middle ear effusion. Tympanic membrane is not erythematous or retracted.     Left Ear: Tympanic membrane is not erythematous, retracted or bulging.     Ears:     Comments: Right TM opaque    Nose:     Right Sinus: Maxillary sinus tenderness present.     Left Sinus: Maxillary sinus tenderness present.     Mouth/Throat:     Pharynx: Posterior oropharyngeal erythema present. No oropharyngeal exudate.  Cardiovascular:     Rate and Rhythm: Normal rate and regular rhythm.     Pulses: Normal pulses.  Pulmonary:     Effort: Pulmonary effort is normal. No respiratory distress.     Breath sounds: No wheezing or rhonchi.  Musculoskeletal:     Cervical back: Normal range of motion.  Lymphadenopathy:     Cervical: No cervical adenopathy.  Skin:    General: Skin is warm and dry.     Findings: No rash.  Neurological:     Mental Status: He is alert and oriented to person, place, and time.  Psychiatric:        Mood and Affect: Mood normal.        Behavior: Behavior normal.     Wt Readings from Last 3 Encounters:  05/05/22 205 lb (93 kg)  02/01/22 209 lb (94.8 kg)  08/31/21 206 lb (93.4 kg)    BP 106/84   Pulse 79   Temp 98 F (36.7 C) (Oral)   Ht 5\' 9"  (1.753 m)   Wt 205 lb (93 kg)   SpO2 97%   BMI 30.27 kg/m   Assessment and Plan: 1. Acute non-recurrent maxillary sinusitis Continue Flonase Add Coricidin HBP Note to be out of work . - amoxicillin-clavulanate (AUGMENTIN) 875-125 MG tablet; Take 1 tablet by mouth 2 (two) times daily for 10 days.  Dispense: 20 tablet; Refill: 0 - promethazine-dextromethorphan (PROMETHAZINE-DM) 6.25-15 MG/5ML syrup; Take 5 mLs by mouth 4 (four) times daily as needed for up to 9 days for cough.  Dispense: 118 mL; Refill: 0   Partially dictated using 01-30-1991. Any errors are unintentional.  Barista, MD [facility]  05/05/2022

## 2022-06-30 ENCOUNTER — Ambulatory Visit
Admission: RE | Admit: 2022-06-30 | Discharge: 2022-06-30 | Disposition: A | Discharge status: Home / Self Care | Payer: 59 | Attending: Family Medicine | Admitting: Family Medicine

## 2022-06-30 ENCOUNTER — Other Ambulatory Visit: Payer: Self-pay

## 2022-06-30 ENCOUNTER — Ambulatory Visit: Payer: 59 | Admitting: Family Medicine

## 2022-06-30 ENCOUNTER — Ambulatory Visit
Admission: RE | Admit: 2022-06-30 | Discharge: 2022-06-30 | Disposition: A | Discharge status: Home / Self Care | Payer: 59 | Source: Ambulatory Visit | Attending: Family Medicine | Admitting: Family Medicine

## 2022-06-30 ENCOUNTER — Encounter: Payer: Self-pay | Admitting: Family Medicine

## 2022-06-30 VITALS — BP 120/80 | HR 78 | Ht 69.0 in | Wt 210.4 lb

## 2022-06-30 DIAGNOSIS — M4727 Other spondylosis with radiculopathy, lumbosacral region: Secondary | ICD-10-CM

## 2022-06-30 DIAGNOSIS — M545 Low back pain, unspecified: Secondary | ICD-10-CM | POA: Insufficient documentation

## 2022-06-30 DIAGNOSIS — K5900 Constipation, unspecified: Secondary | ICD-10-CM | POA: Diagnosis not present

## 2022-06-30 DIAGNOSIS — R1084 Generalized abdominal pain: Secondary | ICD-10-CM | POA: Diagnosis not present

## 2022-06-30 DIAGNOSIS — K5909 Other constipation: Secondary | ICD-10-CM

## 2022-06-30 MED ORDER — POLYETHYLENE GLYCOL 3350 17 GM/SCOOP PO POWD: 17.0000 g | Freq: Two times a day (BID) | ORAL | 3 refills | Status: DC

## 2022-06-30 MED ORDER — SENNOSIDES-DOCUSATE SODIUM 8.6-50 MG PO TABS: 2.0000 | ORAL_TABLET | Freq: Two times a day (BID) | ORAL | 0 refills | Status: DC

## 2022-06-30 MED ORDER — MELOXICAM 15 MG PO TABS: 15.0000 mg | ORAL_TABLET | Freq: Every day | ORAL | 0 refills | Status: DC

## 2022-06-30 MED ORDER — DICYCLOMINE HCL 20 MG PO TABS: 20.0000 mg | ORAL_TABLET | Freq: Three times a day (TID) | ORAL | 0 refills | Status: DC

## 2022-06-30 NOTE — Assessment & Plan Note (Signed)
Chronic issue with recurrent symptomatology, worst in the morning, has to lean forward to alleviate symptoms, denies any radiation distally, as needed ibuprofen that he primarily takes for headaches.  Given his plain films showing intervertebral narrowing, subtle listhesis, and facet hypertrophy primarily to the L5-S1, positive straight leg raise on the left and generalized deconditioning at the left lumbosacral spine and left hip, his findings are most consistent with lumbosacral spondylosis with left-sided radiculopathy.  I discussed treatment strategies and he will dose 1 week of meloxicam, start home-based rehab, and maintain follow-up in 6 weeks for reassessment.  Recalcitrant symptoms would be best addressed with physical therapy at this stage, can also consider advanced imaging and referral to spine group if symptoms dictate.

## 2022-06-30 NOTE — Progress Notes (Signed)
Primary Care / Sports Medicine Office Visit  Patient Information:  Patient ID: Christopher Harrington, male DOB: Mar 10, 1981 Age: 41 y.o. MRN: [id number]   Christopher Harrington is a pleasant 41 y.o. male presenting with the following:  Chief Complaint  Patient presents with   Back Pain    Having pain from stomach pains. Has felt bloating for a year or so, did try miralax. Doesn't think it helped.     Vitals:   06/30/22 1439  BP: 120/80  Pulse: 78  SpO2: 98%   Vitals:   06/30/22 1439  Weight: 210 lb 6.4 oz (95.4 kg)  Height: 5\' 9"  (1.753 m)   Body mass index is 31.07 kg/m.  No results found.   Independent interpretation of notes and tests performed by another provider:   Independent interpretation of lumbar spine x-rays dated 06/30/2022 reveal somewhat straightening of the expected lumbar lordosis, intervertebral narrowing primarily at L5-S1 but subtle anterolisthesis of L5 on S1, grade 1, facet hypertrophy at the same level  Independent interpretation of KUB x-ray dated 06/30/2022 reveals significant stool burden extending throughout the ascending, transverse, descending colon  Procedures performed:   None  Pertinent History, Exam, Impression, and Recommendations:   Problem List Items Addressed This Visit       Digestive   Chronic constipation - Primary    Chronic issue with ongoing symptomatology, recent x-rays show extent of involvement involving the ascending, transverse, descending large colon, this can definitely contribute to his comorbid back pain.  He has been aggressively hydrating, did dose MiraLAX for 1 week in the past with limited response so self discontinued.  His abdominal examination is significant for hypoactive bowel sounds and mild tenderness without rebound in the right lower quadrant.  Plan for restart of MiraLAX, lifestyle modifications, senna-docusate, and initiation of dicyclomine after 2 weeks with close follow-up with his PCP Dr. 08/30/2022 at the  4-week mark.      Relevant Medications   polyethylene glycol powder (GLYCOLAX/MIRALAX) 17 GM/SCOOP powder   senna-docusate (SENOKOT-S) 8.6-50 MG tablet   dicyclomine (BENTYL) 20 MG tablet     Nervous and Auditory   Lumbosacral spondylosis with radiculopathy    Chronic issue with recurrent symptomatology, worst in the morning, has to lean forward to alleviate symptoms, denies any radiation distally, as needed ibuprofen that he primarily takes for headaches.  Given his plain films showing intervertebral narrowing, subtle listhesis, and facet hypertrophy primarily to the L5-S1, positive straight leg raise on the left and generalized deconditioning at the left lumbosacral spine and left hip, his findings are most consistent with lumbosacral spondylosis with left-sided radiculopathy.  I discussed treatment strategies and he will dose 1 week of meloxicam, start home-based rehab, and maintain follow-up in 6 weeks for reassessment.  Recalcitrant symptoms would be best addressed with physical therapy at this stage, can also consider advanced imaging and referral to spine group if symptoms dictate.      Relevant Medications   meloxicam (MOBIC) 15 MG tablet     Orders & Medications Meds ordered this encounter  Medications   polyethylene glycol powder (GLYCOLAX/MIRALAX) 17 GM/SCOOP powder    Sig: Take 17 g by mouth 2 (two) times daily.    Dispense:  255 g    Refill:  3   senna-docusate (SENOKOT-S) 8.6-50 MG tablet    Sig: Take 2 tablets by mouth 2 (two) times daily. Until stooling regularly    Dispense:  60 tablet    Refill:  0  dicyclomine (BENTYL) 20 MG tablet    Sig: Take 1 tablet (20 mg total) by mouth 4 (four) times daily -  before meals and at bedtime.    Dispense:  120 tablet    Refill:  0   meloxicam (MOBIC) 15 MG tablet    Sig: Take 1 tablet (15 mg total) by mouth daily.    Dispense:  30 tablet    Refill:  0   No orders of the defined types were placed in this encounter.     Return in about 6 weeks (around 08/11/2022).     Jerrol Banana, MD   Primary Care Sports Medicine [facility]

## 2022-06-30 NOTE — Assessment & Plan Note (Signed)
Chronic issue with ongoing symptomatology, recent x-rays show extent of involvement involving the ascending, transverse, descending large colon, this can definitely contribute to his comorbid back pain.  He has been aggressively hydrating, did dose MiraLAX for 1 week in the past with limited response so self discontinued.  His abdominal examination is significant for hypoactive bowel sounds and mild tenderness without rebound in the right lower quadrant.  Plan for restart of MiraLAX, lifestyle modifications, senna-docusate, and initiation of dicyclomine after 2 weeks with close follow-up with his PCP Dr. Judithann Graves at the 4-week mark.

## 2022-06-30 NOTE — Patient Instructions (Addendum)
For bowels - Review information provided regarding fiber intake, water intake, gentle activity - Restart MiraLAX, increase every 2-3 days with goal of 1-2 smooth bowel movements daily - Can use senna-docusate (senna-S) to initiate bowel movement, may aggravate stomach pain - If still dealing with bowel issues in 2 weeks, start Bentyl (dicyclomine) and take scheduled until follow-up with Dr. Judithann Graves  For low back - Start meloxicam daily x7 days then daily as needed - Start home exercises from the following website: https://orthoinfo.aaos.org/globalassets/pdfs/2017-rehab_spine.pdf - Return for follow-up in 6 weeks

## 2022-07-25 ENCOUNTER — Other Ambulatory Visit: Payer: Self-pay | Admitting: Family Medicine

## 2022-07-25 DIAGNOSIS — K5909 Other constipation: Secondary | ICD-10-CM

## 2022-07-26 NOTE — Telephone Encounter (Signed)
Please advise who to prescribe under

## 2022-07-26 NOTE — Telephone Encounter (Signed)
Requested medication (s) are due for refill today - yes  Requested medication (s) are on the active medication list -yes  Future visit scheduled -yes  Last refill: 06/30/22 #120  Notes to clinic: Rx written without RF- acute visit- request sent for review   Requested Prescriptions  Pending Prescriptions Disp Refills   dicyclomine (BENTYL) 20 MG tablet [Pharmacy Med Name: DICYCLOMINE 20 MG TABLET] 120 tablet 0    Sig: Take 1 tablet (20 mg total) by mouth 4 (four) times daily -  before meals and at bedtime.     Gastroenterology:  Antispasmodic Agents Passed - 07/25/2022 11:32 AM      Passed - Valid encounter within last 12 months    Recent Outpatient Visits           3 weeks ago Chronic constipation   [place] [facility] at [facility], Ocie Bob, MD   2 months ago Acute non-recurrent maxillary sinusitis   [place] [facility] at [facility], Nyoka Cowden, MD   5 months ago Dizziness   [place] [facility] at [facility], Nyoka Cowden, MD   10 months ago Ear canal abrasion, right, initial encounter   [facility] and [facility] Medicine at [facility], Nyoka Cowden, MD   1 year ago Perineal abscess, superficial   [place] [facility] and [facility] Medicine at [facility], Nyoka Cowden, MD       Future Appointments             In 1 week Judithann Graves Nyoka Cowden, MD [facility] Primary Care and [facility] Medicine at [facility], [facility]   In 2 weeks Reubin Milan, MD [facility] Primary Care and Sports Medicine at [facility], [facility]               Requested Prescriptions  Pending Prescriptions Disp Refills   dicyclomine (BENTYL) 20 MG tablet [Pharmacy Med Name: DICYCLOMINE 20 MG TABLET] 120 tablet 0    Sig: Take 1 tablet (20 mg total) by mouth 4 (four) times daily -  before meals and at bedtime.     Gastroenterology:  Antispasmodic Agents Passed  - 07/25/2022 11:32 AM      Passed - Valid encounter within last 12 months    Recent Outpatient Visits           3 weeks ago Chronic constipation   [place] [facility] at [facility], Ocie Bob, MD   2 months ago Acute non-recurrent maxillary sinusitis   [place] [facility] Medicine at [facility], Nyoka Cowden, MD   5 months ago Dizziness   [place] [facility] and Sports Medicine at [facility], Nyoka Cowden, MD   10 months ago Ear canal abrasion, right, initial encounter   [facility] and [facility] at [facility], Nyoka Cowden, MD   1 year ago Perineal abscess, superficial   [place] [facility] and Sports Medicine at [facility], Nyoka Cowden, MD       Future Appointments             In 1 week Judithann Graves Nyoka Cowden, MD [facility] Primary Care and Sports Medicine at [facility], [facility]   In 2 weeks Judithann Graves, Nyoka Cowden, MD [facility] Primary Care and Sports Medicine at [facility], [facility]

## 2022-07-30 ENCOUNTER — Other Ambulatory Visit: Payer: Self-pay | Admitting: Family Medicine

## 2022-07-30 DIAGNOSIS — M4727 Other spondylosis with radiculopathy, lumbosacral region: Secondary | ICD-10-CM

## 2022-08-02 NOTE — Telephone Encounter (Signed)
Requested Prescriptions  Pending Prescriptions Disp Refills  . meloxicam (MOBIC) 15 MG tablet [Pharmacy Med Name: MELOXICAM 15 MG TABLET] 30 tablet 0    Sig: TAKE 1 TABLET (15 MG TOTAL) BY MOUTH DAILY.     Analgesics:  COX2 Inhibitors Failed - 07/30/2022  9:23 AM      Failed - Manual Review: Labs are only required if the patient has taken medication for more than 8 weeks.      Passed - HGB in normal range and within 360 days    Hemoglobin  Date Value Ref Range Status  02/02/2022 15.3 13.0 - 17.0 g/dL Final         Passed - Cr in normal range and within 360 days    Creatinine, Ser  Date Value Ref Range Status  02/02/2022 1.03 0.61 - 1.24 mg/dL Final         Passed - HCT in normal range and within 360 days    HCT  Date Value Ref Range Status  02/02/2022 43.5 39.0 - 52.0 % Final         Passed - AST in normal range and within 360 days    AST  Date Value Ref Range Status  02/02/2022 17 15 - 41 U/L Final         Passed - ALT in normal range and within 360 days    ALT  Date Value Ref Range Status  02/02/2022 17 0 - 44 U/L Final         Passed - eGFR is 30 or above and within 360 days    GFR calc Af Amer  Date Value Ref Range Status  12/30/2020 94 >59 mL/min/1.73 Final    Comment:    **In accordance with recommendations from the NKF-ASN Task force,**   [facility] is in the process of updating its eGFR calculation to the   2021 CKD-EPI creatinine equation that estimates kidney function   without a race variable.    GFR, Estimated  Date Value Ref Range Status  02/02/2022 >60 >60 mL/min Final    Comment:    (NOTE) Calculated using the CKD-EPI Creatinine Equation (2021)          Passed - Patient is not pregnant      Passed - Valid encounter within last 12 months    Recent Outpatient Visits          1 month ago Chronic constipation   [place] [facility] at [place], Earley Abide, MD   2 months ago Acute non-recurrent maxillary  sinusitis   [place] [facility] at [facility], Jesse Sans, MD   6 months ago Dizziness   [place] [facility] Medicine at [facility], Jesse Sans, MD   11 months ago Ear canal abrasion, right, initial encounter   [facility] and [facility] Medicine at [facility], Jesse Sans, MD   1 year ago Perineal abscess, superficial   [place] [facility] and Sports Medicine at [facility], Jesse Sans, MD      Future Appointments            In 6 days Army Melia Jesse Sans, MD [facility] Primary Care and Sports Medicine at [facility], [facility]   In 1 week Army Melia, Jesse Sans, MD [place] [facility] and Sports Medicine at [facility], [facility]

## 2022-08-03 ENCOUNTER — Other Ambulatory Visit: Payer: Self-pay | Admitting: Internal Medicine

## 2022-08-03 DIAGNOSIS — L409 Psoriasis, unspecified: Secondary | ICD-10-CM

## 2022-08-04 NOTE — Telephone Encounter (Signed)
Requested medication (s) are due for refill today: yes  Requested medication (s) are on the active medication list: yes  Last refill:  12/28/21 60 g  Future visit scheduled: yes  Notes to clinic:  med not delegated to NT to RF   Requested Prescriptions  Pending Prescriptions Disp Refills   clobetasol ointment (TEMOVATE) 0.05 % [Pharmacy Med Name: CLOBETASOL 0.05% OINTMENT] 60 g 0    Sig: APPLY TO AFFECTED AREA TWICE A DAY     Not Delegated - Dermatology:  Corticosteroids Failed - 08/03/2022  1:28 PM      Failed - This refill cannot be delegated      Passed - Valid encounter within last 12 months    Recent Outpatient Visits           1 month ago Chronic constipation   [place] [facility] at [facility], Ocie Bob, MD   3 months ago Acute non-recurrent maxillary sinusitis   [place] [facility] and [facility] Medicine at [facility], Nyoka Cowden, MD   6 months ago Dizziness   [place] [facility] Medicine at [facility], Nyoka Cowden, MD   11 months ago Ear canal abrasion, right, initial encounter   [facility] and Sports Medicine at [facility], Nyoka Cowden, MD   1 year ago Perineal abscess, superficial   [place] [facility] and Sports Medicine at [facility], Nyoka Cowden, MD       Future Appointments             In 4 days Judithann Graves Nyoka Cowden, MD [facility] Primary Care and Sports Medicine at [facility], [facility]   In 1 week Judithann Graves, Nyoka Cowden, MD [facility] Primary Care and Sports Medicine at [facility], [facility]

## 2022-08-08 ENCOUNTER — Ambulatory Visit: Payer: 59 | Admitting: Internal Medicine

## 2022-08-08 ENCOUNTER — Encounter: Payer: Self-pay | Admitting: Internal Medicine

## 2022-08-08 VITALS — BP 112/70 | HR 88 | Ht 69.0 in | Wt 205.0 lb

## 2022-08-08 DIAGNOSIS — K5909 Other constipation: Secondary | ICD-10-CM | POA: Diagnosis not present

## 2022-08-08 DIAGNOSIS — L723 Sebaceous cyst: Secondary | ICD-10-CM | POA: Diagnosis not present

## 2022-08-08 DIAGNOSIS — M79671 Pain in right foot: Secondary | ICD-10-CM

## 2022-08-08 NOTE — Patient Instructions (Addendum)
Take the Senekot-S about 2-3 times per week on a schedule

## 2022-08-08 NOTE — Progress Notes (Signed)
Date:  08/08/2022   Name:  Christopher Harrington   DOB:  [birth year]   MRN:  [id number]   Chief Complaint: Constipation (Senakot helps him to have a bowel movement. Last BM was yesterday, and it was still hard.)  Constipation This is a chronic problem. The problem has been gradually improving since onset. His stool frequency is 1 time per day. The stool is described as firm. The patient is not on a high fiber diet. He Exercises regularly. There has Been adequate water intake. Pertinent negatives include no abdominal pain, diarrhea, fever, nausea or vomiting. Treatments tried: Senekot-S recommended - he has only taken it a few times bec he is worried about having diarrhea or urgency while out on the job.  Foot Injury  There was no injury mechanism. The pain is present in the right foot. Quality: nail thickening and discomfort. The pain is mild. The pain has been Fluctuating since onset. The symptoms are aggravated by weight bearing.    Lab Results  Component Value Date   NA 131 (L) 02/02/2022   K 4.0 02/02/2022   CO2 26 02/02/2022   GLUCOSE 120 (H) 02/02/2022   BUN 19 02/02/2022   CREATININE 1.03 02/02/2022   CALCIUM 9.3 02/02/2022   GFRNONAA >60 02/02/2022   Lab Results  Component Value Date   CHOL 150 04/23/2020   HDL 49 04/23/2020   LDLCALC 91 04/23/2020   TRIG 51 04/23/2020   CHOLHDL 3.1 04/23/2020   No results found for: "TSH" Lab Results  Component Value Date   HGBA1C 5.5 02/04/2022   Lab Results  Component Value Date   WBC 13.1 (H) 02/02/2022   HGB 15.3 02/02/2022   HCT 43.5 02/02/2022   MCV 86.5 02/02/2022   PLT 305 02/02/2022   Lab Results  Component Value Date   ALT 17 02/02/2022   AST 17 02/02/2022   ALKPHOS 71 02/02/2022   BILITOT 0.8 02/02/2022   No results found for: "25OHVITD2", "25OHVITD3", "VD25OH"   Review of Systems  Constitutional:  Negative for appetite change, fatigue and fever.  Respiratory:  Negative for chest tightness and shortness of  breath.   Cardiovascular:  Negative for chest pain.  Gastrointestinal:  Positive for abdominal distention and constipation. Negative for abdominal pain, blood in stool, diarrhea, nausea and vomiting.  Psychiatric/Behavioral:  Negative for dysphoric mood and sleep disturbance. The patient is not nervous/anxious.     Patient Active Problem List   Diagnosis Date Noted   Lumbosacral spondylosis with radiculopathy 06/30/2022   Impingement syndrome of shoulder region 05/08/2020   Chronic constipation 01/07/2020   Eustachian tube dysfunction, bilateral 04/01/2019   Psoriasis 10/16/2017   History of recurrent ear infection 10/16/2017   Obesity (BMI 30.0-34.9) 10/25/2016   Anxiety 03/26/2015   GERD without esophagitis 03/26/2015    No Known Allergies  History reviewed. No pertinent surgical history.  Social History   Tobacco Use   Smoking status: Never   Smokeless tobacco: Never  Vaping Use   Vaping Use: Never used  Substance Use Topics   Alcohol use: Yes    Comment: socially- once a year   Drug use: No     Medication list has been reviewed and updated.  Current Meds  Medication Sig   clobetasol ointment (TEMOVATE) 0.05 % APPLY TO AFFECTED AREA TWICE A DAY   dicyclomine (BENTYL) 20 MG tablet Take 1 tablet (20 mg total) by mouth 4 (four) times daily -  before meals and at bedtime.  fluticasone (FLONASE) 50 MCG/ACT nasal spray Place daily into both nostrils.   meloxicam (MOBIC) 15 MG tablet TAKE 1 TABLET (15 MG TOTAL) BY MOUTH DAILY.   polyethylene glycol powder (GLYCOLAX/MIRALAX) 17 GM/SCOOP powder Take 17 g by mouth 2 (two) times daily.   senna-docusate (SENOKOT-S) 8.6-50 MG tablet Take 2 tablets by mouth 2 (two) times daily. Until stooling regularly   Tapinarof (VTAMA) 1 % CREA Apply 1 application topically daily. Qd to aa psoriasis on body and in scalp   UNABLE TO FIND Med Name: lions mane OTC       08/08/2022    3:49 PM 06/30/2022    2:41 PM 05/05/2022   11:09 AM 02/01/2022     3:48 PM  GAD 7 : Generalized Anxiety Score  Nervous, Anxious, on Edge 0 1 1 0  Control/stop worrying 0 1 1 0  Worry too much - different things 0 1 1 0  Trouble relaxing 0 0 1 0  Restless 0 0 0 0  Easily annoyed or irritable 0 1 2 0  Afraid - awful might happen 0 1 0 0  Total GAD 7 Score 0 5 6 0  Anxiety Difficulty Not difficult at all Not difficult at all Not difficult at all Not difficult at all       08/08/2022    3:48 PM 06/30/2022    2:41 PM 05/05/2022   11:09 AM  Depression screen PHQ 2/9  Decreased Interest 0 1 1  Down, Depressed, Hopeless 0 1 1  PHQ - 2 Score 0 2 2  Altered sleeping 1 1 1   Tired, decreased energy 1 1 1   Change in appetite 0 1 1  Feeling bad or failure about yourself  0 0 0  Trouble concentrating 0 1 0  Moving slowly or fidgety/restless 0 0 0  Suicidal thoughts 0 0 0  PHQ-9 Score 2 6 5   Difficult doing work/chores Somewhat difficult Not difficult at all Not difficult at all    BP Readings from Last 3 Encounters:  08/08/22 112/70  06/30/22 120/80  05/05/22 106/84    Physical Exam Vitals and nursing note reviewed.  Constitutional:      General: He is not in acute distress.    Appearance: Normal appearance. He is well-developed.  HENT:     Head: Normocephalic and atraumatic.      Comments: 1 cm smooth soft mobile non tender subcut mass Cardiovascular:     Rate and Rhythm: Normal rate and regular rhythm.  Pulmonary:     Effort: Pulmonary effort is normal. No respiratory distress.     Breath sounds: No wheezing or rhonchi.  Abdominal:     General: Abdomen is flat. There is no distension.     Palpations: Abdomen is soft.     Tenderness: There is no abdominal tenderness.  Musculoskeletal:     Cervical back: Normal range of motion.     Right lower leg: No edema.     Left lower leg: No edema.  Lymphadenopathy:     Cervical: No cervical adenopathy.  Skin:    General: Skin is warm and dry.     Findings: No rash.     Comments: Thick callus  on right foot. Thick discolored nails  Neurological:     Mental Status: He is alert and oriented to person, place, and time.  Psychiatric:        Mood and Affect: Mood normal.        Behavior: Behavior normal.  Wt Readings from Last 3 Encounters:  08/08/22 205 lb (93 kg)  06/30/22 210 lb 6.4 oz (95.4 kg)  05/05/22 205 lb (93 kg)    BP 112/70   Pulse 88   Ht 5\' 9"  (1.753 m)   Wt 205 lb (93 kg)   SpO2 98%   BMI 30.27 kg/m   Assessment and Plan: 1. Chronic constipation Recommend Senekot-S dosing on a regular schedule 2-3 times per week until he is having regular stools without straining  2. Sebaceous cyst Pt is reassured - he can have this removed if it enlarges  3. Foot pain, right - Ambulatory referral to Podiatry   Partially dictated using Dragon software. Any errors are unintentional.  , MD [facility]  08/08/2022

## 2022-08-12 ENCOUNTER — Ambulatory Visit: Payer: 59 | Admitting: Internal Medicine

## 2022-11-08 ENCOUNTER — Ambulatory Visit: Payer: 59 | Admitting: Internal Medicine

## 2022-11-29 ENCOUNTER — Ambulatory Visit: Payer: 59 | Admitting: Internal Medicine

## 2022-12-05 ENCOUNTER — Other Ambulatory Visit: Payer: Self-pay | Admitting: Internal Medicine

## 2022-12-05 DIAGNOSIS — L409 Psoriasis, unspecified: Secondary | ICD-10-CM

## 2023-01-04 ENCOUNTER — Other Ambulatory Visit: Payer: Self-pay | Admitting: Internal Medicine

## 2023-01-04 ENCOUNTER — Encounter: Payer: Self-pay | Admitting: Internal Medicine

## 2023-01-04 DIAGNOSIS — K5909 Other constipation: Secondary | ICD-10-CM

## 2023-01-30 ENCOUNTER — Encounter: Payer: Self-pay | Admitting: Internal Medicine

## 2023-01-31 ENCOUNTER — Ambulatory Visit: Payer: 59 | Admitting: Physician Assistant

## 2023-02-01 ENCOUNTER — Other Ambulatory Visit: Payer: Self-pay | Admitting: Internal Medicine

## 2023-02-01 ENCOUNTER — Other Ambulatory Visit: Payer: Self-pay | Admitting: Dermatology

## 2023-02-01 DIAGNOSIS — L409 Psoriasis, unspecified: Secondary | ICD-10-CM

## 2023-02-08 ENCOUNTER — Encounter: Payer: Self-pay | Admitting: Internal Medicine

## 2023-02-08 ENCOUNTER — Encounter: Payer: 59 | Admitting: Internal Medicine

## 2023-02-08 ENCOUNTER — Ambulatory Visit (INDEPENDENT_AMBULATORY_CARE_PROVIDER_SITE_OTHER): Payer: 59 | Admitting: Internal Medicine

## 2023-02-08 VITALS — BP 124/80 | HR 62 | Ht 69.0 in | Wt 193.0 lb

## 2023-02-08 DIAGNOSIS — H6502 Acute serous otitis media, left ear: Secondary | ICD-10-CM | POA: Diagnosis not present

## 2023-02-08 DIAGNOSIS — Z13220 Encounter for screening for lipoid disorders: Secondary | ICD-10-CM

## 2023-02-08 DIAGNOSIS — Z0000 Encounter for general adult medical examination without abnormal findings: Secondary | ICD-10-CM | POA: Diagnosis not present

## 2023-02-08 DIAGNOSIS — K5909 Other constipation: Secondary | ICD-10-CM

## 2023-02-08 DIAGNOSIS — Z131 Encounter for screening for diabetes mellitus: Secondary | ICD-10-CM

## 2023-02-08 DIAGNOSIS — L409 Psoriasis, unspecified: Secondary | ICD-10-CM

## 2023-02-08 DIAGNOSIS — Z1159 Encounter for screening for other viral diseases: Secondary | ICD-10-CM

## 2023-02-08 MED ORDER — AZITHROMYCIN 250 MG PO TABS: ORAL_TABLET | ORAL | 0 refills | Status: AC

## 2023-02-08 MED ORDER — VTAMA 1 % EX CREA: 1.0000 "application " | TOPICAL_CREAM | Freq: Every day | CUTANEOUS | 4 refills | Status: AC

## 2023-02-08 NOTE — Progress Notes (Signed)
Date:  02/08/2023   Name:  Christopher Harrington   DOB:  [birth year]   MRN:  [id number]   Chief Complaint: Annual Exam and Ear Pain (L) ear to jaw is hurting on L) side) Christopher Harrington is a 42 y.o. male who presents today for his Complete Annual Exam. He feels well. He reports exercising work. He reports he is sleeping well.   Colonoscopy: none  Immunization History  Administered Date(s) Administered   Janssen (J&J) SARS-COV-2 Vaccination 06/15/2020   Tdap 03/26/2015   There are no preventive care reminders to display for this patient.   No results found for: "PSA1", "PSA"  Constipation This is a chronic problem. The problem is unchanged. Pertinent negatives include no abdominal pain, back pain, diarrhea or difficulty urinating. He has tried laxatives and stool softeners for the symptoms. The treatment provided mild relief.    Lab Results  Component Value Date   NA 131 (L) 02/02/2022   K 4.0 02/02/2022   CO2 26 02/02/2022   GLUCOSE 120 (H) 02/02/2022   BUN 19 02/02/2022   CREATININE 1.03 02/02/2022   CALCIUM 9.3 02/02/2022   GFRNONAA >60 02/02/2022   Lab Results  Component Value Date   CHOL 150 04/23/2020   HDL 49 04/23/2020   LDLCALC 91 04/23/2020   TRIG 51 04/23/2020   CHOLHDL 3.1 04/23/2020   No results found for: "TSH" Lab Results  Component Value Date   HGBA1C 5.5 02/04/2022   Lab Results  Component Value Date   WBC 13.1 (H) 02/02/2022   HGB 15.3 02/02/2022   HCT 43.5 02/02/2022   MCV 86.5 02/02/2022   PLT 305 02/02/2022   Lab Results  Component Value Date   ALT 17 02/02/2022   AST 17 02/02/2022   ALKPHOS 71 02/02/2022   BILITOT 0.8 02/02/2022   No results found for: "25OHVITD2", "25OHVITD3", "VD25OH"   Review of Systems  Constitutional:  Negative for appetite change, chills, diaphoresis, fatigue and unexpected weight change.  HENT:  Positive for ear pain and sinus pressure. Negative for hearing loss, tinnitus, trouble swallowing and voice  change.   Eyes:  Negative for visual disturbance.  Respiratory:  Negative for choking, shortness of breath and wheezing.   Cardiovascular:  Negative for chest pain, palpitations and leg swelling.  Gastrointestinal:  Positive for constipation. Negative for abdominal pain, blood in stool and diarrhea.  Genitourinary:  Negative for difficulty urinating, dysuria and frequency.  Musculoskeletal:  Negative for arthralgias, back pain and myalgias.  Skin:  Positive for rash. Negative for color change.  Neurological:  Negative for dizziness, syncope and headaches.  Hematological:  Negative for adenopathy.  Psychiatric/Behavioral:  Negative for dysphoric mood and sleep disturbance. The patient is not nervous/anxious.     Patient Active Problem List   Diagnosis Date Noted   Sebaceous cyst 08/08/2022   Lumbosacral spondylosis with radiculopathy 06/30/2022   Impingement syndrome of shoulder region 05/08/2020   Chronic constipation 01/07/2020   Eustachian tube dysfunction, bilateral 04/01/2019   Psoriasis 10/16/2017   History of recurrent ear infection 10/16/2017   Obesity (BMI 30.0-34.9) 10/25/2016   Anxiety 03/26/2015   GERD without esophagitis 03/26/2015    No Known Allergies  No past surgical history on file.  Social History   Tobacco Use   Smoking status: Never   Smokeless tobacco: Never  Vaping Use   Vaping Use: Never used  Substance Use Topics   Alcohol use: Yes    Comment: socially- once a year  Drug use: No     Medication list has been reviewed and updated.  Current Meds  Medication Sig   azithromycin (ZITHROMAX Z-PAK) 250 MG tablet UAD   clobetasol ointment (TEMOVATE) 0.05 % APPLY TO AFFECTED AREA TWICE A DAY   fluticasone (FLONASE) 50 MCG/ACT nasal spray Place daily into both nostrils.   senna-docusate (SENOKOT-S) 8.6-50 MG tablet Take 2 tablets by mouth 2 (two) times daily. Until stooling regularly   UNABLE TO FIND Med Name: lions mane OTC   [DISCONTINUED]  meloxicam (MOBIC) 15 MG tablet TAKE 1 TABLET (15 MG TOTAL) BY MOUTH DAILY.       02/08/2023    9:23 AM 08/08/2022    3:49 PM 06/30/2022    2:41 PM 05/05/2022   11:09 AM  GAD 7 : Generalized Anxiety Score  Nervous, Anxious, on Edge 2 0 1 1  Control/stop worrying 0 0 1 1  Worry too much - different things 1 0 1 1  Trouble relaxing 1 0 0 1  Restless 1 0 0 0  Easily annoyed or irritable 0 0 1 2  Afraid - awful might happen 0 0 1 0  Total GAD 7 Score 5 0 5 6  Anxiety Difficulty Not difficult at all Not difficult at all Not difficult at all Not difficult at all       02/08/2023    9:22 AM 08/08/2022    3:48 PM 06/30/2022    2:41 PM  Depression screen PHQ 2/9  Decreased Interest 1 0 1  Down, Depressed, Hopeless 0 0 1  PHQ - 2 Score 1 0 2  Altered sleeping '1 1 1  '$ Tired, decreased energy '1 1 1  '$ Change in appetite 1 0 1  Feeling bad or failure about yourself  1 0 0  Trouble concentrating 1 0 1  Moving slowly or fidgety/restless 0 0 0  Suicidal thoughts 0 0 0  PHQ-9 Score '6 2 6  '$ Difficult doing work/chores Not difficult at all Somewhat difficult Not difficult at all    BP Readings from Last 3 Encounters:  02/08/23 124/80  08/08/22 112/70  06/30/22 120/80    Physical Exam Vitals and nursing note reviewed.  Constitutional:      Appearance: Normal appearance. He is well-developed.  HENT:     Head: Normocephalic.     Right Ear: Tympanic membrane, ear canal and external ear normal.     Left Ear: Tympanic membrane, ear canal and external ear normal.     Nose: Nose normal.  Eyes:     Conjunctiva/sclera: Conjunctivae normal.     Pupils: Pupils are equal, round, and reactive to light.  Neck:     Thyroid: No thyromegaly.     Vascular: No carotid bruit.  Cardiovascular:     Rate and Rhythm: Normal rate and regular rhythm.     Heart sounds: Normal heart sounds.  Pulmonary:     Effort: Pulmonary effort is normal.     Breath sounds: Normal breath sounds. No wheezing.  Chest:   Breasts:    Right: No mass.     Left: No mass.  Abdominal:     General: Bowel sounds are normal.     Palpations: Abdomen is soft.     Tenderness: There is no abdominal tenderness.  Musculoskeletal:        General: Normal range of motion.     Cervical back: Normal range of motion and neck supple.  Lymphadenopathy:     Cervical: No cervical adenopathy.  Skin:    General: Skin is warm and dry.  Neurological:     Mental Status: He is alert and oriented to person, place, and time.     Deep Tendon Reflexes: Reflexes are normal and symmetric.  Psychiatric:        Attention and Perception: Attention normal.        Mood and Affect: Mood normal.        Thought Content: Thought content normal.     Wt Readings from Last 3 Encounters:  02/08/23 193 lb (87.5 kg)  08/08/22 205 lb (93 kg)  06/30/22 210 lb 6.4 oz (95.4 kg)    BP 124/80   Pulse 62   Ht '5\' 9"'$  (1.753 m)   Wt 193 lb (87.5 kg)   SpO2 99%   BMI 28.50 kg/m   Assessment and Plan:  Problem List Items Addressed This Visit       Digestive   Chronic constipation (Chronic)    Chronic complaint managed with Miralax and Senekot  GI referral done - appointment later this month      Relevant Orders   Comprehensive metabolic panel   TSH     Musculoskeletal and Integument   Psoriasis   Relevant Medications   Tapinarof (VTAMA) 1 % CREA   Other Visit Diagnoses     Annual physical exam    -  Primary   Relevant Orders   CBC with Differential/Platelet   Comprehensive metabolic panel   Hemoglobin A1c   Hepatitis C antibody   Lipid panel   TSH   Screening for diabetes mellitus       Relevant Orders   Hemoglobin A1c   Screening for lipid disorders       Relevant Orders   Lipid panel   Need for hepatitis C screening test       Relevant Orders   Hepatitis C antibody   Non-recurrent acute serous otitis media of left ear       Relevant Medications   azithromycin (ZITHROMAX Z-PAK) 250 MG tablet       No  follow-ups on file.   Partially dictated using [employer], any errors are not intentional.  Glean Hess, MD [place], [place]

## 2023-02-08 NOTE — Assessment & Plan Note (Addendum)
Chronic complaint managed with Miralax and Senekot  GI referral done - appointment later this month

## 2023-02-09 LAB — CBC WITH DIFFERENTIAL/PLATELET
Basophils Absolute: 0 10*3/uL (ref 0.0–0.2)
Basos: 1 %
EOS (ABSOLUTE): 0.1 10*3/uL (ref 0.0–0.4)
Eos: 1 %
Hematocrit: 44.3 % (ref 37.5–51.0)
Hemoglobin: 15.1 g/dL (ref 13.0–17.7)
Immature Grans (Abs): 0 10*3/uL (ref 0.0–0.1)
Immature Granulocytes: 0 %
Lymphocytes Absolute: 1.9 10*3/uL (ref 0.7–3.1)
Lymphs: 23 %
MCH: 30 pg (ref 26.6–33.0)
MCHC: 34.1 g/dL (ref 31.5–35.7)
MCV: 88 fL (ref 79–97)
Monocytes Absolute: 0.7 10*3/uL (ref 0.1–0.9)
Monocytes: 8 %
Neutrophils Absolute: 5.7 10*3/uL (ref 1.4–7.0)
Neutrophils: 67 %
Platelets: 280 10*3/uL (ref 150–450)
RBC: 5.03 x10E6/uL (ref 4.14–5.80)
RDW: 12.6 % (ref 11.6–15.4)
WBC: 8.5 10*3/uL (ref 3.4–10.8)

## 2023-02-09 LAB — COMPREHENSIVE METABOLIC PANEL
ALT: 13 IU/L (ref 0–44)
AST: 17 IU/L (ref 0–40)
Albumin/Globulin Ratio: 2 (ref 1.2–2.2)
Albumin: 4.7 g/dL (ref 4.1–5.1)
Alkaline Phosphatase: 73 IU/L (ref 44–121)
BUN/Creatinine Ratio: 13 (ref 9–20)
BUN: 14 mg/dL (ref 6–24)
Bilirubin Total: 0.6 mg/dL (ref 0.0–1.2)
CO2: 25 mmol/L (ref 20–29)
Calcium: 9.7 mg/dL (ref 8.7–10.2)
Chloride: 101 mmol/L (ref 96–106)
Creatinine, Ser: 1.04 mg/dL (ref 0.76–1.27)
Globulin, Total: 2.3 g/dL (ref 1.5–4.5)
Glucose: 94 mg/dL (ref 70–99)
Potassium: 4.7 mmol/L (ref 3.5–5.2)
Sodium: 139 mmol/L (ref 134–144)
Total Protein: 7 g/dL (ref 6.0–8.5)
eGFR: 91 mL/min/{1.73_m2} (ref >59)

## 2023-02-09 LAB — LIPID PANEL
Chol/HDL Ratio: 2.8 ratio (ref 0.0–5.0)
Cholesterol, Total: 146 mg/dL (ref 100–199)
HDL: 53 mg/dL (ref >39)
LDL Chol Calc (NIH): 79 mg/dL (ref 0–99)
Triglycerides: 69 mg/dL (ref 0–149)
VLDL Cholesterol Cal: 14 mg/dL (ref 5–40)

## 2023-02-09 LAB — HEMOGLOBIN A1C
Est. average glucose Bld gHb Est-mCnc: 120 mg/dL
Hgb A1c MFr Bld: 5.8 % — ABNORMAL HIGH (ref 4.8–5.6)

## 2023-02-09 LAB — TSH: TSH: 2.17 u[IU]/mL (ref 0.450–4.500)

## 2023-02-09 LAB — HEPATITIS C ANTIBODY: Hep C Virus Ab: NONREACTIVE

## 2023-02-20 ENCOUNTER — Encounter: Payer: 59 | Admitting: Internal Medicine

## 2023-02-21 ENCOUNTER — Ambulatory Visit (INDEPENDENT_AMBULATORY_CARE_PROVIDER_SITE_OTHER): Payer: 59 | Admitting: Gastroenterology

## 2023-02-21 ENCOUNTER — Encounter: Payer: Self-pay | Admitting: Gastroenterology

## 2023-02-21 ENCOUNTER — Other Ambulatory Visit: Payer: Self-pay

## 2023-02-21 VITALS — BP 126/86 | HR 59 | Temp 97.5°F | Ht 69.0 in | Wt 190.0 lb

## 2023-02-21 DIAGNOSIS — K5900 Constipation, unspecified: Secondary | ICD-10-CM | POA: Diagnosis not present

## 2023-02-21 DIAGNOSIS — R1031 Right lower quadrant pain: Secondary | ICD-10-CM | POA: Diagnosis not present

## 2023-02-21 DIAGNOSIS — G8929 Other chronic pain: Secondary | ICD-10-CM

## 2023-02-21 MED ORDER — NA SULFATE-K SULFATE-MG SULF 17.5-3.13-1.6 GM/177ML PO SOLN: 354.0000 mL | Freq: Once | ORAL | 0 refills | Status: AC
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
## 2023-03-01 ENCOUNTER — Encounter: Payer: Self-pay | Admitting: Internal Medicine

## 2023-03-07 ENCOUNTER — Encounter: Payer: Self-pay | Admitting: Gastroenterology

## 2023-03-15 ENCOUNTER — Encounter: Payer: Self-pay | Admitting: Gastroenterology

## 2023-03-16 ENCOUNTER — Encounter
Admission: RE | Disposition: A | Discharge status: Home / Self Care | Payer: Self-pay | Source: Home / Self Care | Attending: Gastroenterology

## 2023-03-16 ENCOUNTER — Other Ambulatory Visit: Payer: Self-pay

## 2023-03-16 ENCOUNTER — Ambulatory Visit: Payer: 59 | Admitting: Anesthesiology

## 2023-03-16 ENCOUNTER — Encounter: Payer: Self-pay | Admitting: Gastroenterology

## 2023-03-16 ENCOUNTER — Ambulatory Visit
Admission: RE | Admit: 2023-03-16 | Discharge: 2023-03-16 | Disposition: A | Discharge status: Home / Self Care | Payer: 59 | Attending: Gastroenterology | Admitting: Gastroenterology

## 2023-03-16 DIAGNOSIS — F419 Anxiety disorder, unspecified: Secondary | ICD-10-CM | POA: Diagnosis not present

## 2023-03-16 DIAGNOSIS — K5900 Constipation, unspecified: Secondary | ICD-10-CM

## 2023-03-16 DIAGNOSIS — R1031 Right lower quadrant pain: Secondary | ICD-10-CM | POA: Diagnosis not present

## 2023-03-16 DIAGNOSIS — G8929 Other chronic pain: Secondary | ICD-10-CM

## 2023-03-16 HISTORY — PX: COLONOSCOPY WITH PROPOFOL: SHX5780

## 2023-03-16 SURGERY — COLONOSCOPY WITH PROPOFOL
Anesthesia: General | Site: Rectum

## 2023-03-16 MED ORDER — STERILE WATER FOR IRRIGATION IR SOLN
Status: DC | PRN
  Administered 2023-03-16: 150 mL

## 2023-03-16 MED ORDER — LACTATED RINGERS IV SOLN: INTRAVENOUS | Status: DC

## 2023-03-16 MED ORDER — LIDOCAINE HCL (CARDIAC) PF 100 MG/5ML IV SOSY
PREFILLED_SYRINGE | INTRAVENOUS | Status: DC | PRN
  Administered 2023-03-16: 100 mg via INTRAVENOUS

## 2023-03-16 MED ORDER — PROPOFOL 500 MG/50ML IV EMUL
INTRAVENOUS | Status: DC | PRN
  Administered 2023-03-16: 180 ug/kg/min via INTRAVENOUS

## 2023-03-16 MED ORDER — PROPOFOL 10 MG/ML IV BOLUS
INTRAVENOUS | Status: DC | PRN
  Administered 2023-03-16: 100 mg via INTRAVENOUS

## 2023-03-16 MED ORDER — SODIUM CHLORIDE 0.9 % IV SOLN: INTRAVENOUS | Status: DC

## 2023-03-16 SURGICAL SUPPLY — 25 items

## 2023-03-16 NOTE — Anesthesia Postprocedure Evaluation (Signed)
Anesthesia Post Note  Patient: Christopher Harrington  Procedure(s) Performed: COLONOSCOPY WITH PROPOFOL (Rectum)  Patient location during evaluation: PACU Anesthesia Type: General Level of consciousness: awake and alert Pain management: pain level controlled Vital Signs Assessment: post-procedure vital signs reviewed and stable Respiratory status: spontaneous breathing, nonlabored ventilation, respiratory function stable and patient connected to nasal cannula oxygen Cardiovascular status: blood pressure returned to baseline and stable Postop Assessment: no apparent nausea or vomiting Anesthetic complications: no   No notable events documented.   Last Vitals:  Vitals:   03/16/23 0902 03/16/23 0915  BP: 107/65 (!) 147/84  Pulse: (!) 54 71  Resp: 15 15  Temp: 36.5 C 36.5 C  SpO2: 97% 98%    Last Pain:  Vitals:   03/16/23 0915  TempSrc:   PainSc: 0-No pain                 Arneda Sappington C Armani Brar
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
## 2023-03-16 NOTE — Op Note (Signed)
[facility] Gastroenterology Patient Name: Christopher Harrington Procedure Date: 03/16/2023 8:38 AM MRN: [id number] Account #: [id number] Date of Birth: 1981-01-13 Admit Type: Outpatient Age: 42 Room: [facility] OR ROOM 01 Gender: Male Note Status: Finalized Instrument Name: [id number] Procedure:             Colonoscopy Indications:           This is the patient's first colonoscopy, Abdominal                         pain in the right lower quadrant Providers:             Toney Reil MD, MD Referring MD:          Bari Edward, MD (Referring MD) Medicines:             General Anesthesia Complications:         No immediate complications. Estimated blood loss: None. Procedure:             Pre-Anesthesia Assessment:                        - Prior to the procedure, a History and Physical was                         performed, and patient medications and allergies were                         reviewed. The patient is competent. The risks and                         benefits of the procedure and the sedation options and                         risks were discussed with the patient. All questions                         were answered and informed consent was obtained.                         Patient identification and proposed procedure were                         verified by the physician, the nurse, the                         anesthesiologist, the anesthetist and the technician                         in the pre-procedure area in the procedure room in the                         endoscopy suite. Mental Status Examination: alert and                         oriented. Airway Examination: normal oropharyngeal                         airway and neck mobility. Respiratory Examination:  clear to auscultation. CV Examination: normal.                         Prophylactic Antibiotics: The patient does not require                         prophylactic antibiotics.  Prior Anticoagulants: The                         patient has taken no anticoagulant or antiplatelet                         agents. ASA Grade Assessment: I - A normal, healthy                         patient. After reviewing the risks and benefits, the                         patient was deemed in satisfactory condition to                         undergo the procedure. The anesthesia plan was to use                         general anesthesia. Immediately prior to                         administration of medications, the patient was                         re-assessed for adequacy to receive sedatives. The                         heart rate, respiratory rate, oxygen saturations,                         blood pressure, adequacy of pulmonary ventilation, and                         response to care were monitored throughout the                         procedure. The physical status of the patient was                         re-assessed after the procedure.                        After obtaining informed consent, the colonoscope was                         passed under direct vision. Throughout the procedure,                         the patient's blood pressure, pulse, and oxygen                         saturations were monitored continuously. The  Colonoscope was introduced through the anus and                         advanced to the the terminal ileum, with                         identification of the appendiceal orifice and IC                         valve. The colonoscopy was performed without                         difficulty. The patient tolerated the procedure well.                         The quality of the bowel preparation was good. The                         terminal ileum, ileocecal valve, appendiceal orifice,                         and rectum were photographed. Findings:      The perianal and digital rectal examinations were normal. Pertinent        negatives include normal sphincter tone and no palpable rectal lesions.      The terminal ileum appeared normal.      The entire examined colon appeared normal.      The retroflexed view of the distal rectum and anal verge was normal and       showed no anal or rectal abnormalities. Impression:            - The examined portion of the ileum was normal.                        - The entire examined colon is normal.                        - The distal rectum and anal verge are normal on                         retroflexion view.                        - No specimens collected. Recommendation:        - Discharge patient to home (with escort).                        - High fiber diet.                        - Continue present medications. Procedure Code(s):     --- Professional ---                        [phone], Colonoscopy, flexible; diagnostic, including                         collection of specimen(s) by brushing or washing, when  performed (separate procedure) Diagnosis Code(s):     --- Professional ---                        R10.31, Right lower quadrant pain CPT copyright 2022 American Medical Association. All rights reserved. The codes documented in this report are preliminary and upon coder review may  be revised to meet current compliance requirements. Dr. Libby Maw Toney Reil MD, MD 03/16/2023 9:01:10 AM This report has been signed electronically. Number of Addenda: 0 Note Initiated On: 03/16/2023 8:38 AM Scope Withdrawal Time: 0 hours 8 minutes 47 seconds  Total Procedure Duration: 0 hours 10 minutes 21 seconds  Estimated Blood Loss:  Estimated blood loss: none.      [facility]

## 2023-03-16 NOTE — Transfer of Care (Signed)
Immediate Anesthesia Transfer of Care Note  Patient: Christopher Harrington  Procedure(s) Performed: COLONOSCOPY WITH PROPOFOL (Rectum)  Patient Location: PACU  Anesthesia Type:General  Level of Consciousness: awake  Airway & Oxygen Therapy: Patient Spontanous Breathing  Post-op Assessment: Report given to RN  Post vital signs: Reviewed and stable  Last Vitals:  Vitals Value Taken Time  BP 107/65 03/16/23 0902  Temp 36.5 C 03/16/23 0902  Pulse 62 03/16/23 0903  Resp 16 03/16/23 0903  SpO2 97 % 03/16/23 0903  Vitals shown include unvalidated device data.  Last Pain:  Vitals:   03/16/23 0902  TempSrc:   PainSc: Asleep         Complications: No notable events documented.

## 2023-03-16 NOTE — Anesthesia Preprocedure Evaluation (Signed)
Anesthesia Evaluation    Airway Mallampati: II  TM Distance: >3 FB Neck ROM: Full    Dental no notable dental hx.  Significant large chip left upper central incisor, none loose:   Pulmonary    Pulmonary exam normal breath sounds clear to auscultation       Cardiovascular Normal cardiovascular exam Rhythm:Regular Rate:Normal     Neuro/Psych  PSYCHIATRIC DISORDERS Anxiety      Neuromuscular disease    GI/Hepatic ,GERD  ,,  Endo/Other    Renal/GU      Musculoskeletal  (+) Arthritis ,    Abdominal Normal abdominal exam  (+)   Peds  Hematology   Anesthesia Other Findings PTSD  Reproductive/Obstetrics                             Anesthesia Physical Anesthesia Plan  ASA: 1  Anesthesia Plan: General   Post-op Pain Management:    Induction: Intravenous  PONV Risk Score and Plan:   Airway Management Planned: Natural Airway and Nasal Cannula  Additional Equipment:   Intra-op Plan:   Post-operative Plan:   Informed Consent: I have reviewed the patients History and Physical, chart, labs and discussed the procedure including the risks, benefits and alternatives for the proposed anesthesia with the patient or authorized representative who has indicated his/her understanding and acceptance.     Dental Advisory Given  Plan Discussed with: Anesthesiologist, CRNA and Surgeon  Anesthesia Plan Comments: (Patient consented for risks of anesthesia including but not limited to:  - adverse reactions to medications - risk of airway placement if required - damage to eyes, teeth, lips or other oral mucosa - nerve damage due to positioning  - sore throat or hoarseness - Damage to heart, brain, nerves, lungs, other parts of body or loss of life  Patient voiced understanding.)       Anesthesia Quick Evaluation

## 2023-03-17 ENCOUNTER — Encounter: Payer: Self-pay | Admitting: Gastroenterology

## 2023-05-29 ENCOUNTER — Encounter: Payer: Self-pay | Admitting: Gastroenterology

## 2023-05-30 ENCOUNTER — Encounter: Payer: Self-pay | Admitting: Internal Medicine

## 2023-05-30 ENCOUNTER — Ambulatory Visit: Payer: 59 | Admitting: Internal Medicine

## 2023-05-30 VITALS — BP 124/78 | HR 54 | Ht 69.0 in | Wt 178.0 lb

## 2023-05-30 DIAGNOSIS — R7303 Prediabetes: Secondary | ICD-10-CM | POA: Diagnosis not present

## 2023-05-30 DIAGNOSIS — R1011 Right upper quadrant pain: Secondary | ICD-10-CM | POA: Diagnosis not present

## 2023-05-30 NOTE — Progress Notes (Signed)
Date:  05/30/2023   Name:  Christopher Harrington   DOB:  1981/05/31   MRN:  [id number]   Chief Complaint: Abdominal Pain (URQ pain. Sore or inflammed per patient. Patient working out twice a day. Losing weight and working on diet.)  Abdominal Pain This is a new problem. The current episode started 1 to 4 weeks ago. The problem occurs intermittently. The problem has been unchanged. The pain is located in the RUQ. The pain is mild. The abdominal pain does not radiate. Associated symptoms include constipation (unchanged). Pertinent negatives include no anorexia, diarrhea, fever, frequency, vomiting or weight loss. Nothing (possibly certain movements) aggravates the pain. The pain is relieved by Being still. He has tried nothing for the symptoms.    Lab Results  Component Value Date   NA 139 02/08/2023   K 4.7 02/08/2023   CO2 25 02/08/2023   GLUCOSE 94 02/08/2023   BUN 14 02/08/2023   CREATININE 1.04 02/08/2023   CALCIUM 9.7 02/08/2023   EGFR 91 02/08/2023   GFRNONAA >60 02/02/2022   Lab Results  Component Value Date   CHOL 146 02/08/2023   HDL 53 02/08/2023   LDLCALC 79 02/08/2023   TRIG 69 02/08/2023   CHOLHDL 2.8 02/08/2023   Lab Results  Component Value Date   TSH 2.170 02/08/2023   Lab Results  Component Value Date   HGBA1C 5.8 (H) 02/08/2023   Lab Results  Component Value Date   WBC 8.5 02/08/2023   HGB 15.1 02/08/2023   HCT 44.3 02/08/2023   MCV 88 02/08/2023   PLT 280 02/08/2023   Lab Results  Component Value Date   ALT 13 02/08/2023   AST 17 02/08/2023   ALKPHOS 73 02/08/2023   BILITOT 0.6 02/08/2023   No results found for: "25OHVITD2", "25OHVITD3", "VD25OH"   Review of Systems  Constitutional:  Positive for unexpected weight change (has lost 25 lbs with diet changes and exercise -). Negative for appetite change, diaphoresis, fever and weight loss.  Respiratory:  Negative for chest tightness and shortness of breath.   Cardiovascular:  Negative for  chest pain, palpitations and leg swelling.  Gastrointestinal:  Positive for abdominal pain and constipation (unchanged). Negative for abdominal distention, anorexia, blood in stool, diarrhea and vomiting.  Genitourinary:  Negative for frequency.  Psychiatric/Behavioral:  Positive for sleep disturbance. Negative for dysphoric mood. The patient is not nervous/anxious.     Patient Active Problem List   Diagnosis Date Noted   Sebaceous cyst 08/08/2022   Lumbosacral spondylosis with radiculopathy 06/30/2022   Impingement syndrome of shoulder region 05/08/2020   Chronic constipation 01/07/2020   Eustachian tube dysfunction, bilateral 04/01/2019   Psoriasis 10/16/2017   History of recurrent ear infection 10/16/2017   Anxiety 03/26/2015   GERD without esophagitis 03/26/2015    No Known Allergies  Past Surgical History:  Procedure Laterality Date   COLONOSCOPY WITH PROPOFOL N/A 03/16/2023   Procedure: COLONOSCOPY WITH PROPOFOL;  Surgeon: Toney Reil, MD;  Location: [facility];  Service: Endoscopy;  Laterality: N/A;    Social History   Tobacco Use   Smoking status: Never   Smokeless tobacco: Never  Vaping Use   Vaping Use: Never used  Substance Use Topics   Alcohol use: Yes    Comment: socially- once a year   Drug use: No     Medication list has been reviewed and updated.  Current Meds  Medication Sig   clobetasol ointment (TEMOVATE) 0.05 % APPLY TO AFFECTED AREA  TWICE A DAY   fluticasone (FLONASE) 50 MCG/ACT nasal spray Place daily into both nostrils.   senna-docusate (SENOKOT-S) 8.6-50 MG tablet Take 2 tablets by mouth 2 (two) times daily. Until stooling regularly   Tapinarof (VTAMA) 1 % CREA Apply 1 application  topically daily. Qd to aa psoriasis on body and in scalp   UNABLE TO FIND Med Name: lions mane OTC       05/30/2023    4:14 PM 02/08/2023    9:23 AM 08/08/2022    3:49 PM 06/30/2022    2:41 PM  GAD 7 : Generalized Anxiety Score  Nervous, Anxious,  on Edge 0 2 0 1  Control/stop worrying 0 0 0 1  Worry too much - different things 0 1 0 1  Trouble relaxing 0 1 0 0  Restless 0 1 0 0  Easily annoyed or irritable 1 0 0 1  Afraid - awful might happen 0 0 0 1  Total GAD 7 Score 1 5 0 5  Anxiety Difficulty Not difficult at all Not difficult at all Not difficult at all Not difficult at all       05/30/2023    4:14 PM 02/08/2023    9:22 AM 08/08/2022    3:48 PM  Depression screen PHQ 2/9  Decreased Interest 1 1 0  Down, Depressed, Hopeless 0 0 0  PHQ - 2 Score 1 1 0  Altered sleeping 1 1 1   Tired, decreased energy 1 1 1   Change in appetite 1 1 0  Feeling bad or failure about yourself  0 1 0  Trouble concentrating 0 1 0  Moving slowly or fidgety/restless 0 0 0  Suicidal thoughts 0 0 0  PHQ-9 Score 4 6 2   Difficult doing work/chores Not difficult at all Not difficult at all Somewhat difficult    BP Readings from Last 3 Encounters:  05/30/23 124/78  03/16/23 (!) 147/84  02/21/23 126/86    Physical Exam Vitals and nursing note reviewed.  Constitutional:      General: He is not in acute distress.    Appearance: He is well-developed.  HENT:     Head: Normocephalic and atraumatic.  Pulmonary:     Effort: Pulmonary effort is normal. No respiratory distress.  Abdominal:     General: Abdomen is flat. Bowel sounds are normal.     Palpations: Abdomen is soft.     Tenderness: There is no abdominal tenderness. There is no right CVA tenderness, left CVA tenderness, guarding or rebound.  Skin:    General: Skin is warm and dry.     Findings: No rash.  Neurological:     Mental Status: He is alert and oriented to person, place, and time.  Psychiatric:        Mood and Affect: Mood normal.        Behavior: Behavior normal.     Wt Readings from Last 3 Encounters:  05/30/23 178 lb (80.7 kg)  03/16/23 179 lb 3.2 oz (81.3 kg)  02/21/23 190 lb (86.2 kg)    BP 124/78   Pulse (!) 54   Ht 5\' 9"  (1.753 m)   Wt 178 lb (80.7 kg)   SpO2  97%   BMI 26.29 kg/m   Assessment and Plan:  Problem List Items Addressed This Visit   None Visit Diagnoses     RUQ abdominal pain    -  Primary   suspect muscular in origin will obtain screening labs avoid exercises that seem to  trigger discomfort for several weeks   Relevant Orders   CBC with Differential/Platelet   Comprehensive metabolic panel   Prediabetes       he has done great with diet changes and weight loss expect A1C to be normal.   Relevant Orders   Hemoglobin A1c       No follow-ups on file.   Partially dictated using Dragon software, any errors are not intentional.  Reubin Milan, MD [facility] [place], [place]

## 2023-05-31 LAB — COMPREHENSIVE METABOLIC PANEL
ALT: 12 IU/L (ref 0–44)
AST: 14 IU/L (ref 0–40)
Albumin: 4.6 g/dL (ref 4.1–5.1)
Alkaline Phosphatase: 73 IU/L (ref 44–121)
BUN/Creatinine Ratio: 15 (ref 9–20)
BUN: 16 mg/dL (ref 6–24)
Bilirubin Total: 0.5 mg/dL (ref 0.0–1.2)
CO2: 26 mmol/L (ref 20–29)
Calcium: 9.4 mg/dL (ref 8.7–10.2)
Chloride: 101 mmol/L (ref 96–106)
Creatinine, Ser: 1.06 mg/dL (ref 0.76–1.27)
Globulin, Total: 2.7 g/dL (ref 1.5–4.5)
Glucose: 85 mg/dL (ref 70–99)
Potassium: 4.2 mmol/L (ref 3.5–5.2)
Sodium: 139 mmol/L (ref 134–144)
Total Protein: 7.3 g/dL (ref 6.0–8.5)
eGFR: 89 mL/min/{1.73_m2} (ref >59)

## 2023-05-31 LAB — CBC WITH DIFFERENTIAL/PLATELET
Basophils Absolute: 0 10*3/uL (ref 0.0–0.2)
Basos: 0 %
EOS (ABSOLUTE): 0.1 10*3/uL (ref 0.0–0.4)
Eos: 1 %
Hematocrit: 38.8 % (ref 37.5–51.0)
Hemoglobin: 13.6 g/dL (ref 13.0–17.7)
Immature Grans (Abs): 0 10*3/uL (ref 0.0–0.1)
Immature Granulocytes: 0 %
Lymphocytes Absolute: 2 10*3/uL (ref 0.7–3.1)
Lymphs: 28 %
MCH: 30.5 pg (ref 26.6–33.0)
MCHC: 35.1 g/dL (ref 31.5–35.7)
MCV: 87 fL (ref 79–97)
Monocytes Absolute: 0.6 10*3/uL (ref 0.1–0.9)
Monocytes: 8 %
Neutrophils Absolute: 4.4 10*3/uL (ref 1.4–7.0)
Neutrophils: 63 %
Platelets: 264 10*3/uL (ref 150–450)
RBC: 4.46 x10E6/uL (ref 4.14–5.80)
RDW: 12.8 % (ref 11.6–15.4)
WBC: 7.1 10*3/uL (ref 3.4–10.8)

## 2023-05-31 LAB — HEMOGLOBIN A1C
Est. average glucose Bld gHb Est-mCnc: 108 mg/dL
Hgb A1c MFr Bld: 5.4 % (ref 4.8–5.6)

## 2023-06-02 ENCOUNTER — Other Ambulatory Visit: Payer: Self-pay | Admitting: Internal Medicine

## 2023-06-02 DIAGNOSIS — L409 Psoriasis, unspecified: Secondary | ICD-10-CM

## 2023-07-10 ENCOUNTER — Encounter: Payer: Self-pay | Admitting: Internal Medicine

## 2023-08-02 DIAGNOSIS — M545 Low back pain, unspecified: Secondary | ICD-10-CM | POA: Diagnosis not present

## 2023-08-26 ENCOUNTER — Other Ambulatory Visit: Payer: Self-pay | Admitting: Internal Medicine

## 2023-08-26 DIAGNOSIS — L409 Psoriasis, unspecified: Secondary | ICD-10-CM

## 2023-12-01 ENCOUNTER — Ambulatory Visit: Payer: 59 | Admitting: Internal Medicine

## 2023-12-01 VITALS — BP 128/74 | HR 61 | Temp 97.7°F | Ht 69.0 in | Wt 187.0 lb

## 2023-12-01 DIAGNOSIS — J40 Bronchitis, not specified as acute or chronic: Secondary | ICD-10-CM | POA: Diagnosis not present

## 2023-12-01 MED ORDER — PROMETHAZINE-DM 6.25-15 MG/5ML PO SYRP
5.0000 mL | ORAL_SOLUTION | Freq: Four times a day (QID) | ORAL | 0 refills | Status: AC | PRN
Start: 2023-12-01 — End: 2023-12-10

## 2023-12-01 MED ORDER — AZITHROMYCIN 250 MG PO TABS
ORAL_TABLET | ORAL | 0 refills | Status: AC
Start: 2023-12-01 — End: 2023-12-06

## 2023-12-01 NOTE — Progress Notes (Signed)
 Date:  12/01/2023   Name:  Christopher Harrington   DOB:  01-Nov-1981   MRN:  [id number]   Chief Complaint: Cough (Started Monday. Coughing keeping him up at night. Brown/ green mucous. No SOB. No fever. )  Cough This is a new problem. The current episode started in the past 7 days. The cough is Productive of sputum. Pertinent negatives include no chest pain, chills, fever, headaches, shortness of breath or wheezing.    Review of Systems  Constitutional:  Negative for chills, fatigue and fever.  Respiratory:  Positive for cough. Negative for shortness of breath and wheezing.   Cardiovascular:  Negative for chest pain and palpitations.  Neurological:  Negative for dizziness and headaches.  Psychiatric/Behavioral:  Negative for dysphoric mood and sleep disturbance. The patient is not nervous/anxious.      Lab Results  Component Value Date   NA 139 05/30/2023   K 4.2 05/30/2023   CO2 26 05/30/2023   GLUCOSE 85 05/30/2023   BUN 16 05/30/2023   CREATININE 1.06 05/30/2023   CALCIUM 9.4 05/30/2023   EGFR 89 05/30/2023   GFRNONAA >60 02/02/2022   Lab Results  Component Value Date   CHOL 146 02/08/2023   HDL 53 02/08/2023   LDLCALC 79 02/08/2023   TRIG 69 02/08/2023   CHOLHDL 2.8 02/08/2023   Lab Results  Component Value Date   TSH 2.170 02/08/2023   Lab Results  Component Value Date   HGBA1C 5.4 05/30/2023   Lab Results  Component Value Date   WBC 7.1 05/30/2023   HGB 13.6 05/30/2023   HCT 38.8 05/30/2023   MCV 87 05/30/2023   PLT 264 05/30/2023   Lab Results  Component Value Date   ALT 12 05/30/2023   AST 14 05/30/2023   ALKPHOS 73 05/30/2023   BILITOT 0.5 05/30/2023   No results found for: MARIEN BOLLS, VD25OH   Patient Active Problem List   Diagnosis Date Noted   Sebaceous cyst 08/08/2022   Lumbosacral spondylosis with radiculopathy 06/30/2022   Impingement syndrome of shoulder region 05/08/2020   Chronic constipation 01/07/2020    Eustachian tube dysfunction, bilateral 04/01/2019   Psoriasis 10/16/2017   History of recurrent ear infection 10/16/2017   Anxiety 03/26/2015   GERD without esophagitis 03/26/2015    No Known Allergies  Past Surgical History:  Procedure Laterality Date   COLONOSCOPY WITH PROPOFOL  N/A 03/16/2023   Procedure: COLONOSCOPY WITH PROPOFOL ;  Surgeon: Unk Corinn Skiff, MD;  Location: [facility];  Service: Endoscopy;  Laterality: N/A;    Social History   Tobacco Use   Smoking status: Never   Smokeless tobacco: Never  Vaping Use   Vaping status: Never Used  Substance Use Topics   Alcohol use: Yes    Comment: socially- once a year   Drug use: No     Medication list has been reviewed and updated.  Current Meds  Medication Sig   azithromycin  (ZITHROMAX  Z-PAK) 250 MG tablet UAD   clobetasol  ointment (TEMOVATE ) 0.05 % APPLY TO AFFECTED AREA TWICE A DAY   fluticasone (FLONASE) 50 MCG/ACT nasal spray Place daily into both nostrils.   promethazine -dextromethorphan (PROMETHAZINE -DM) 6.25-15 MG/5ML syrup Take 5 mLs by mouth 4 (four) times daily as needed for up to 9 days for cough.   Tapinarof  (VTAMA ) 1 % CREA Apply 1 application  topically daily. Qd to aa psoriasis on body and in scalp   UNABLE TO FIND Med Name: lions mane OTC   [DISCONTINUED] senna-docusate (SENOKOT-S)  8.6-50 MG tablet Take 2 tablets by mouth 2 (two) times daily. Until stooling regularly       05/30/2023    4:14 PM 02/08/2023    9:23 AM 08/08/2022    3:49 PM 06/30/2022    2:41 PM  GAD 7 : Generalized Anxiety Score  Nervous, Anxious, on Edge 0 2 0 1  Control/stop worrying 0 0 0 1  Worry too much - different things 0 1 0 1  Trouble relaxing 0 1 0 0  Restless 0 1 0 0  Easily annoyed or irritable 1 0 0 1  Afraid - awful might happen 0 0 0 1  Total GAD 7 Score 1 5 0 5  Anxiety Difficulty Not difficult at all Not difficult at all Not difficult at all Not difficult at all       05/30/2023    4:14 PM 02/08/2023     9:22 AM 08/08/2022    3:48 PM  Depression screen PHQ 2/9  Decreased Interest 1 1 0  Down, Depressed, Hopeless 0 0 0  PHQ - 2 Score 1 1 0  Altered sleeping 1 1 1   Tired, decreased energy 1 1 1   Change in appetite 1 1 0  Feeling bad or failure about yourself  0 1 0  Trouble concentrating 0 1 0  Moving slowly or fidgety/restless 0 0 0  Suicidal thoughts 0 0 0  PHQ-9 Score 4 6 2   Difficult doing work/chores Not difficult at all Not difficult at all Somewhat difficult    BP Readings from Last 3 Encounters:  12/01/23 128/74  05/30/23 124/78  03/16/23 (!) 147/84    Physical Exam Vitals and nursing note reviewed.  Constitutional:      General: He is not in acute distress.    Appearance: Normal appearance. He is well-developed.  HENT:     Head: Normocephalic and atraumatic.     Right Ear: Tympanic membrane is retracted. Tympanic membrane is not erythematous.     Left Ear: Tympanic membrane is retracted. Tympanic membrane is not erythematous.     Nose:     Right Sinus: No maxillary sinus tenderness or frontal sinus tenderness.     Left Sinus: No maxillary sinus tenderness or frontal sinus tenderness.  Cardiovascular:     Rate and Rhythm: Normal rate and regular rhythm.  Pulmonary:     Effort: Pulmonary effort is normal. No respiratory distress.     Breath sounds: Normal breath sounds. No decreased breath sounds or wheezing.  Skin:    General: Skin is warm and dry.     Findings: No rash.  Neurological:     Mental Status: He is alert and oriented to person, place, and time.  Psychiatric:        Mood and Affect: Mood normal.        Behavior: Behavior normal.     Wt Readings from Last 3 Encounters:  12/01/23 187 lb (84.8 kg)  05/30/23 178 lb (80.7 kg)  03/16/23 179 lb 3.2 oz (81.3 kg)    BP 128/74   Pulse 61   Temp 97.7 F (36.5 C) (Oral)   Ht 5' 9 (1.753 m)   Wt 187 lb (84.8 kg)   SpO2 98%   BMI 27.62 kg/m   Assessment and Plan:  Problem List Items  Addressed This Visit   None Visit Diagnoses       Bronchitis    -  Primary   continue Mucinex  DM note to be out of  work yesterday and today.   Relevant Medications   azithromycin  (ZITHROMAX  Z-PAK) 250 MG tablet   promethazine -dextromethorphan (PROMETHAZINE -DM) 6.25-15 MG/5ML syrup       No follow-ups on file.    Leita HILARIO Adie, MD [facility] [place]

## 2023-12-01 NOTE — Patient Instructions (Signed)
 Resume Mucinex -DM during the day.  Take the prescription cough syrup at night.

## 2023-12-22 ENCOUNTER — Other Ambulatory Visit: Payer: Self-pay | Admitting: Internal Medicine

## 2023-12-22 ENCOUNTER — Other Ambulatory Visit: Payer: Self-pay | Admitting: Family Medicine

## 2023-12-22 DIAGNOSIS — M4727 Other spondylosis with radiculopathy, lumbosacral region: Secondary | ICD-10-CM

## 2023-12-22 DIAGNOSIS — L409 Psoriasis, unspecified: Secondary | ICD-10-CM

## 2023-12-22 NOTE — Telephone Encounter (Signed)
Requested medication (s) are due for refill today: Yes  Requested medication (s) are on the active medication list: Yes  Last refill:  08/26/13  Future visit scheduled: Yes  Notes to clinic:  Not delegated.    Requested Prescriptions  Pending Prescriptions Disp Refills   clobetasol ointment (TEMOVATE) 0.05 % [Pharmacy Med Name: CLOBETASOL 0.05% OINTMENT] 60 g 0    Sig: APPLY TO AFFECTED AREA TWICE A DAY     Not Delegated - Dermatology:  Corticosteroids Failed - 12/22/2023  1:35 PM      Failed - This refill cannot be delegated      Passed - Valid encounter within last 12 months    Recent Outpatient Visits           3 weeks ago Bronchitis   [place] [facility] at [facility], Nyoka Cowden, MD   6 months ago RUQ abdominal pain   [place] [facility] at [facility], Nyoka Cowden, MD   10 months ago Annual physical exam   [facility] at [facility], Nyoka Cowden, MD   1 year ago Chronic constipation   [place] [facility] at [facility], Nyoka Cowden, MD   1 year ago Chronic constipation   [facility] at [facility], Ocie Bob, MD

## 2023-12-22 NOTE — Telephone Encounter (Signed)
D/C 02/08/23. Requested Prescriptions  Refused Prescriptions Disp Refills   meloxicam (MOBIC) 15 MG tablet [Pharmacy Med Name: MELOXICAM 15 MG TABLET] 30 tablet 0    Sig: TAKE 1 TABLET (15 MG TOTAL) BY MOUTH DAILY.     Analgesics:  COX2 Inhibitors Failed - 12/22/2023  3:24 PM      Failed - Manual Review: Labs are only required if the patient has taken medication for more than 8 weeks.      Passed - HGB in normal range and within 360 days    Hemoglobin  Date Value Ref Range Status  05/30/2023 13.6 13.0 - 17.7 g/dL Final         Passed - Cr in normal range and within 360 days    Creatinine, Ser  Date Value Ref Range Status  05/30/2023 1.06 0.76 - 1.27 mg/dL Final         Passed - HCT in normal range and within 360 days    Hematocrit  Date Value Ref Range Status  05/30/2023 38.8 37.5 - 51.0 % Final         Passed - AST in normal range and within 360 days    AST  Date Value Ref Range Status  05/30/2023 14 0 - 40 IU/L Final         Passed - ALT in normal range and within 360 days    ALT  Date Value Ref Range Status  05/30/2023 12 0 - 44 IU/L Final         Passed - eGFR is 30 or above and within 360 days    GFR calc Af Amer  Date Value Ref Range Status  12/30/2020 94 >59 mL/min/1.73 Final    Comment:    **In accordance with recommendations from the NKF-ASN Task force,**   [facility] is in the process of updating its eGFR calculation to the   2021 CKD-EPI creatinine equation that estimates kidney function   without a race variable.    GFR, Estimated  Date Value Ref Range Status  02/02/2022 >60 >60 mL/min Final    Comment:    (NOTE) Calculated using the CKD-EPI Creatinine Equation (2021)    eGFR  Date Value Ref Range Status  05/30/2023 89 >59 mL/min/1.73 Final         Passed - Patient is not pregnant      Passed - Valid encounter within last 12 months    Recent Outpatient Visits           3 weeks ago Bronchitis   [place] [facility] at  [facility], Nyoka Cowden, MD   6 months ago RUQ abdominal pain   [place] [facility] at [facility], Nyoka Cowden, MD   10 months ago Annual physical exam   [facility] at [facility], Nyoka Cowden, MD   1 year ago Chronic constipation   [place] [facility] at [facility], Nyoka Cowden, MD   1 year ago Chronic constipation   [facility] at [facility], Ocie Bob, MD

## 2024-02-01 DIAGNOSIS — M79674 Pain in right toe(s): Secondary | ICD-10-CM | POA: Diagnosis not present

## 2024-02-01 DIAGNOSIS — M79641 Pain in right hand: Secondary | ICD-10-CM | POA: Diagnosis not present

## 2024-02-01 DIAGNOSIS — M79642 Pain in left hand: Secondary | ICD-10-CM | POA: Diagnosis not present

## 2024-02-26 ENCOUNTER — Encounter: Payer: Self-pay | Admitting: Internal Medicine

## 2024-02-26 NOTE — Telephone Encounter (Signed)
 Please review and advise.   JM

## 2024-04-05 ENCOUNTER — Ambulatory Visit: Admitting: Internal Medicine

## 2024-04-05 ENCOUNTER — Encounter: Payer: Self-pay | Admitting: Internal Medicine

## 2024-04-05 VITALS — BP 130/84 | HR 73 | Ht 69.0 in | Wt 196.5 lb

## 2024-04-05 DIAGNOSIS — D1809 Hemangioma of other sites: Secondary | ICD-10-CM | POA: Insufficient documentation

## 2024-04-05 DIAGNOSIS — G8929 Other chronic pain: Secondary | ICD-10-CM | POA: Insufficient documentation

## 2024-04-05 DIAGNOSIS — M79673 Pain in unspecified foot: Secondary | ICD-10-CM

## 2024-04-05 NOTE — Assessment & Plan Note (Signed)
Monitor for change

## 2024-04-05 NOTE — Progress Notes (Signed)
 Date:  04/05/2024   Name:  Christopher Harrington   DOB:  August 29, 1981   MRN:  [id number]   Chief Complaint: Referral (Dermatology, red mole on nose, has been there for 6 months, has not grown or change in color) and Foot Pain (Patient said he feels like a ball on the bottom of his foot, both feet have that feeling)  Foot Injury  There was no injury mechanism. The pain is present in the left foot and right foot. The quality of the pain is described as aching. The pain is moderate. The pain has been Fluctuating since onset. Associated symptoms include an inability to bear weight. Treatments tried: he was seen by Ortho - xrays normal.    Review of Systems  Constitutional:  Negative for chills, fatigue and fever.  Respiratory:  Negative for chest tightness and shortness of breath.   Cardiovascular:  Negative for chest pain.  Musculoskeletal:  Positive for arthralgias and gait problem.  Psychiatric/Behavioral:  Negative for dysphoric mood and sleep disturbance. The patient is not nervous/anxious.      Lab Results  Component Value Date   NA 139 05/30/2023   K 4.2 05/30/2023   CO2 26 05/30/2023   GLUCOSE 85 05/30/2023   BUN 16 05/30/2023   CREATININE 1.06 05/30/2023   CALCIUM 9.4 05/30/2023   EGFR 89 05/30/2023   GFRNONAA >60 02/02/2022   Lab Results  Component Value Date   CHOL 146 02/08/2023   HDL 53 02/08/2023   LDLCALC 79 02/08/2023   TRIG 69 02/08/2023   CHOLHDL 2.8 02/08/2023   Lab Results  Component Value Date   TSH 2.170 02/08/2023   Lab Results  Component Value Date   HGBA1C 5.4 05/30/2023   Lab Results  Component Value Date   WBC 7.1 05/30/2023   HGB 13.6 05/30/2023   HCT 38.8 05/30/2023   MCV 87 05/30/2023   PLT 264 05/30/2023   Lab Results  Component Value Date   ALT 12 05/30/2023   AST 14 05/30/2023   ALKPHOS 73 05/30/2023   BILITOT 0.5 05/30/2023   No results found for: "25OHVITD2", "25OHVITD3", "VD25OH"   Patient Active Problem List   Diagnosis  Date Noted   Chronic foot pain 04/05/2024   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   Sebaceous cyst 08/08/2022   Lumbosacral spondylosis with radiculopathy 06/30/2022   Impingement syndrome of shoulder region 05/08/2020   Chronic constipation 01/07/2020   Eustachian tube dysfunction, bilateral 04/01/2019   Psoriasis 10/16/2017   History of recurrent ear infection 10/16/2017   Anxiety 03/26/2015   GERD without esophagitis 03/26/2015    No Known Allergies  Past Surgical History:  Procedure Laterality Date   COLONOSCOPY WITH PROPOFOL  N/A 03/16/2023   Procedure: COLONOSCOPY WITH PROPOFOL ;  Surgeon: Selena Daily, MD;  Location: [facility];  Service: Endoscopy;  Laterality: N/A;    Social History   Tobacco Use   Smoking status: Never   Smokeless tobacco: Never  Vaping Use   Vaping status: Never Used  Substance Use Topics   Alcohol use: Yes    Comment: socially- once a year   Drug use: No     Medication list has been reviewed and updated.  Current Meds  Medication Sig   CELEBREX 200 MG capsule Take 200 mg by mouth daily.   clobetasol  ointment (TEMOVATE ) 0.05 % APPLY TO AFFECTED AREA TWICE A DAY   fluticasone (FLONASE) 50 MCG/ACT nasal spray Place daily into both nostrils.   Tapinarof  (VTAMA ) 1 %  CREA Apply 1 application  topically daily. Qd to aa psoriasis on body and in scalp   UNABLE TO FIND Med Name: lions mane OTC       04/05/2024    2:42 PM 05/30/2023    4:14 PM 02/08/2023    9:23 AM 08/08/2022    3:49 PM  GAD 7 : Generalized Anxiety Score  Nervous, Anxious, on Edge 0 0 2 0  Control/stop worrying 0 0 0 0  Worry too much - different things 0 0 1 0  Trouble relaxing 0 0 1 0  Restless 0 0 1 0  Easily annoyed or irritable 0 1 0 0  Afraid - awful might happen 0 0 0 0  Total GAD 7 Score 0 1 5 0  Anxiety Difficulty Not difficult at all Not difficult at all Not difficult at all Not difficult at all       04/05/2024    2:41 PM 05/30/2023    4:14 PM 02/08/2023     9:22 AM  Depression screen PHQ 2/9  Decreased Interest 0 1 1  Down, Depressed, Hopeless 0 0 0  PHQ - 2 Score 0 1 1  Altered sleeping 0 1 1  Tired, decreased energy 0 1 1  Change in appetite 0 1 1  Feeling bad or failure about yourself  0 0 1  Trouble concentrating 0 0 1  Moving slowly or fidgety/restless 0 0 0  Suicidal thoughts 0 0 0  PHQ-9 Score 0 4 6  Difficult doing work/chores Not difficult at all Not difficult at all Not difficult at all    BP Readings from Last 3 Encounters:  04/05/24 130/84  12/01/23 128/74  05/30/23 124/78    Physical Exam Vitals and nursing note reviewed.  Constitutional:      General: He is not in acute distress.    Appearance: He is well-developed.  HENT:     Head: Normocephalic and atraumatic.  Pulmonary:     Effort: Pulmonary effort is normal. No respiratory distress.  Musculoskeletal:     Right lower leg: No edema.     Left lower leg: No edema.     Right foot: Prominent metatarsal heads and tenderness present. No swelling, deformity or bunion.     Left foot: Prominent metatarsal heads and tenderness present. No swelling, deformity or bunion.     Comments: No swelling or deformity Tender base of middle toe  Skin:    General: Skin is warm and dry.     Findings: No rash.  Neurological:     Mental Status: He is alert and oriented to person, place, and time.  Psychiatric:        Mood and Affect: Mood normal.        Behavior: Behavior normal.     Wt Readings from Last 3 Encounters:  04/05/24 196 lb 8 oz (89.1 kg)  12/01/23 187 lb (84.8 kg)  05/30/23 178 lb (80.7 kg)    BP 130/84   Pulse 73   Ht 5\' 9"  (1.753 m)   Wt 196 lb 8 oz (89.1 kg)   SpO2 95%   BMI 29.02 kg/m   Assessment and Plan:  Problem List Items Addressed This Visit       Unprioritized   Chronic foot pain   May be due to work boots and/or poorly supportive running shoes Recommend Podiatry referral      Relevant Medications   CELEBREX 200 MG capsule    Other Relevant Orders  Ambulatory referral to Podiatry   Hemangioma of face - Primary   Monitor for change       Return in about 4 months (around 08/06/2024) for CPX.    Sheron Dixons, MD [facility] [place]

## 2024-04-05 NOTE — Assessment & Plan Note (Signed)
 May be due to work boots and/or poorly supportive running shoes Recommend Podiatry referral

## 2024-04-08 DIAGNOSIS — M5412 Radiculopathy, cervical region: Secondary | ICD-10-CM | POA: Diagnosis not present

## 2024-04-24 ENCOUNTER — Ambulatory Visit (INDEPENDENT_AMBULATORY_CARE_PROVIDER_SITE_OTHER)

## 2024-04-24 ENCOUNTER — Ambulatory Visit: Admitting: Podiatry

## 2024-04-24 ENCOUNTER — Encounter: Payer: Self-pay | Admitting: Podiatry

## 2024-04-24 DIAGNOSIS — M2041 Other hammer toe(s) (acquired), right foot: Secondary | ICD-10-CM

## 2024-04-24 DIAGNOSIS — M2042 Other hammer toe(s) (acquired), left foot: Secondary | ICD-10-CM | POA: Diagnosis not present

## 2024-04-24 DIAGNOSIS — G5763 Lesion of plantar nerve, bilateral lower limbs: Secondary | ICD-10-CM

## 2024-04-24 NOTE — Patient Instructions (Signed)
  VISIT SUMMARY: Today, you were seen for pain in your third toes, which has been diagnosed as Morton's neuroma. This condition is causing you significant discomfort, especially when walking, and feels like a ball under your foot. You have been experiencing this pain despite previous treatment and are concerned about continuing your running routine and returning to work.  YOUR PLAN: -MORTON'S NEUROMA: Morton's neuroma is a painful condition that affects the ball of your foot, most commonly the area between your third and fourth toes. It involves a thickening of the tissue around one of the nerves leading to your toes. To treat this, you received a steroid injection today, which is safe despite your recent steroid use for a pinched nerve in your back. You were also given metatarsal pads to help distribute pressure away from the painful area. You should rest for a day after the injection before resuming your running and work activities. Consistent use of the metatarsal pads is expected to improve your symptoms in about a month.  INSTRUCTIONS: Please rest for a day after your injection before resuming running and work. Use the metatarsal pads consistently for about a month to help alleviate the pressure and pain in your foot. If your symptoms do not improve or if you experience any new issues, please schedule a follow-up appointment.                      Contains text generated by Abridge.                                 Contains text generated by Abridge.

## 2024-04-27 NOTE — Progress Notes (Signed)
 Subjective:  Patient ID: Christopher Harrington, male    DOB: 06-May-1981,  MRN: [id number]  Chief Complaint  Patient presents with   Foot Pain    "The third toe on both feet are bothering me.  I went to [facility] about the bone on the side of my left foot. After that, my toes started to hurt."    Discussed the use of AI scribe software for clinical note transcription with the patient, who gave verbal consent to proceed.  History of Present Illness Christopher Harrington is a 43 year old male who presents with pain in the third toes, suspected to be Morton's neuroma.  He experiences pain in his third toes, radiating through the big toe. Initially, he sought care at an emergency orthopedic clinic where he was prescribed medication that he recalls started with a 'C'. Despite this treatment, the pain persisted, particularly at the heel of his third toe, extending upwards, making it difficult to move and causing continuous pain while walking. He describes the sensation as 'like a ball' under his foot that exacerbates the pain when touched.  No recent injuries to the area. No tingling or numbness in the toes at the time of the visit. He mentions having a pinched nerve in his back for which he was recently prescribed a steroid, and he is concerned about potential interactions with new treatments.  He is active and exercises regularly, running every morning. He inquires about the safety of continuing his running routine and returning to work given his current foot pain.      Objective:    Physical Exam VASCULAR: DP and PT pulse palpable. Foot is warm and well-perfused. Capillary fill time is brisk. DERMATOLOGIC: Normal skin turgor, texture, and temperature. No open lesions, rashes, or ulcerations. NEUROLOGIC: Normal sensation to light touch and pressure. No paresthesias. ORTHOPEDIC: Sharp pain on palpation with radiation in the second interspace plantarly bilateral, consistent with Morton's  neuroma. Pain is worse on the left side during palpation. Smooth pain-free range of motion of all examined joints. No ecchymosis or bruising. No gross deformity.   No images are attached to the encounter.    Results Procedure: Bilateral Morton's Neuroma Injection Description: The bilateral second interspace was injected from a dorsal approach with ten milligrams of Kenalog, four milligrams of Xmestone, and five milligrams of Marcaine. The procedure was well-tolerated, and the injection sites were dressed with a bandage. Informed Consent: Counseling included the use of cold spray to numb the skin, a sharp pinch, burning, and pressure sensation. Discussed the use of metatarsal pads to offload pressure and the expected recovery time of about a month. Advised to rest for a day before resuming normal activities.  RADIOLOGY Bilateral foot radiograph: No fracture, stress fracture, or other changes noted (04/24/2024)   Assessment:   1. Morton neuroma of both feet      Plan:  Patient was evaluated and treated and all questions answered.  Assessment and Plan Assessment & Plan Morton's neuroma Morton's neuroma in the third toe with pain radiating to the hallux, exacerbated by pressure, described as a ball-like sensation at the heel of the third toe. No tingling or numbness. Radiographs show no fracture. Diagnosis confirmed by sharp pain on palpation in the second interspace plantarly bilateral. Treatment includes steroid injection and metatarsal pads. Surgery is rarely needed. Injection is safe despite recent steroid use for a pinched nerve in the back. Explained injection process and metatarsal pad use. Advised rest for a day post-injection before  resuming activities. Injection site accessed from the top for comfort, pads placed just behind the painful area to distribute pressure. Expected improvement in about a month with consistent pad use. - Administer bilateral Morton's neuroma injection with  Kenalog, Xmestone, and Marcaine. - Dispense metatarsal pads for offloading pressure. - Instruct to use metatarsal pads for about a month. - Advise rest for a day post-injection before resuming running and work.      Return if symptoms worsen or fail to improve.

## 2024-05-19 ENCOUNTER — Encounter: Payer: Self-pay | Admitting: Internal Medicine

## 2024-05-21 ENCOUNTER — Encounter: Payer: Self-pay | Admitting: Internal Medicine

## 2024-05-21 ENCOUNTER — Ambulatory Visit: Admitting: Internal Medicine

## 2024-05-21 VITALS — BP 128/78 | HR 91 | Temp 97.4°F | Ht 69.0 in | Wt 194.0 lb

## 2024-05-21 DIAGNOSIS — J0100 Acute maxillary sinusitis, unspecified: Secondary | ICD-10-CM | POA: Diagnosis not present

## 2024-05-21 MED ORDER — AMOXICILLIN-POT CLAVULANATE 875-125 MG PO TABS
1.0000 | ORAL_TABLET | Freq: Two times a day (BID) | ORAL | 0 refills | Status: AC
Start: 2024-05-21 — End: 2024-05-31

## 2024-05-21 NOTE — Patient Instructions (Addendum)
 Sudafed 30 mg - take twice a day for sinus congestion and ear congestion.  You can continue to take Mucinex and Aleve  as needed.

## 2024-05-21 NOTE — Progress Notes (Signed)
 Date:  05/21/2024   Name:  Christopher Harrington   DOB:  10-Jan-1981   MRN:  [id number]   Chief Complaint: Cough (X 1 week. Yellow production. Cough is worse at night. Scratchy throat. Left ear pain on and off.)  Cough This is a new problem. The current episode started in the past 7 days. The problem occurs every few hours. The cough is Productive of sputum. Associated symptoms include ear congestion, postnasal drip, rhinorrhea and a sore throat. Pertinent negatives include no chest pain, chills, fever, headaches, shortness of breath or wheezing. Nothing aggravates the symptoms. He has tried OTC cough suppressant (sinus meds) for the symptoms.  Sinus Problem This is a new problem. The current episode started in the past 7 days. The problem is unchanged. There has been no fever. Associated symptoms include congestion, coughing, sinus pressure and a sore throat. Pertinent negatives include no chills, headaches or shortness of breath. Treatments tried: Mucinex and Aleve .    Review of Systems  Constitutional:  Negative for chills and fever.  HENT:  Positive for congestion, postnasal drip, rhinorrhea, sinus pressure and sore throat.   Respiratory:  Positive for cough. Negative for shortness of breath and wheezing.   Cardiovascular:  Negative for chest pain and palpitations.  Neurological:  Negative for headaches.     Lab Results  Component Value Date   NA 139 05/30/2023   K 4.2 05/30/2023   CO2 26 05/30/2023   GLUCOSE 85 05/30/2023   BUN 16 05/30/2023   CREATININE 1.06 05/30/2023   CALCIUM 9.4 05/30/2023   EGFR 89 05/30/2023   GFRNONAA >60 02/02/2022   Lab Results  Component Value Date   CHOL 146 02/08/2023   HDL 53 02/08/2023   LDLCALC 79 02/08/2023   TRIG 69 02/08/2023   CHOLHDL 2.8 02/08/2023   Lab Results  Component Value Date   TSH 2.170 02/08/2023   Lab Results  Component Value Date   HGBA1C 5.4 05/30/2023   Lab Results  Component Value Date   WBC 7.1 05/30/2023    HGB 13.6 05/30/2023   HCT 38.8 05/30/2023   MCV 87 05/30/2023   PLT 264 05/30/2023   Lab Results  Component Value Date   ALT 12 05/30/2023   AST 14 05/30/2023   ALKPHOS 73 05/30/2023   BILITOT 0.5 05/30/2023   No results found for: MARIEN BOLLS, VD25OH   Patient Active Problem List   Diagnosis Date Noted   Chronic foot pain 04/05/2024   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   Sebaceous cyst 08/08/2022   Lumbosacral spondylosis with radiculopathy 06/30/2022   Impingement syndrome of shoulder region 05/08/2020   Chronic constipation 01/07/2020   Eustachian tube dysfunction, bilateral 04/01/2019   Psoriasis 10/16/2017   History of recurrent ear infection 10/16/2017   Anxiety 03/26/2015   GERD without esophagitis 03/26/2015    No Known Allergies  Past Surgical History:  Procedure Laterality Date   COLONOSCOPY WITH PROPOFOL  N/A 03/16/2023   Procedure: COLONOSCOPY WITH PROPOFOL ;  Surgeon: Unk Corinn Skiff, MD;  Location: [facility];  Service: Endoscopy;  Laterality: N/A;    Social History   Tobacco Use   Smoking status: Never   Smokeless tobacco: Never  Vaping Use   Vaping status: Never Used  Substance Use Topics   Alcohol use: Not Currently    Comment: socially- once a year   Drug use: No     Medication list has been reviewed and updated.  Current Meds  Medication Sig  amoxicillin -clavulanate (AUGMENTIN ) 875-125 MG tablet Take 1 tablet by mouth 2 (two) times daily for 10 days.   clobetasol  ointment (TEMOVATE ) 0.05 % APPLY TO AFFECTED AREA TWICE A DAY   fluticasone (FLONASE) 50 MCG/ACT nasal spray Place daily into both nostrils.   Tapinarof  (VTAMA ) 1 % CREA Apply 1 application  topically daily. Qd to aa psoriasis on body and in scalp   UNABLE TO FIND Med Name: lions mane OTC       05/21/2024    2:54 PM 04/05/2024    2:42 PM 05/30/2023    4:14 PM 02/08/2023    9:23 AM  GAD 7 : Generalized Anxiety Score  Nervous, Anxious, on Edge 0 0 0 2   Control/stop worrying 0 0 0 0  Worry too much - different things 0 0 0 1  Trouble relaxing 0 0 0 1  Restless 0 0 0 1  Easily annoyed or irritable 0 0 1 0  Afraid - awful might happen 0 0 0 0  Total GAD 7 Score 0 0 1 5  Anxiety Difficulty Not difficult at all Not difficult at all Not difficult at all Not difficult at all       05/21/2024    2:54 PM 04/05/2024    2:41 PM 05/30/2023    4:14 PM  Depression screen PHQ 2/9  Decreased Interest 0 0 1  Down, Depressed, Hopeless 0 0 0  PHQ - 2 Score 0 0 1  Altered sleeping 0 0 1  Tired, decreased energy 0 0 1  Change in appetite 0 0 1  Feeling bad or failure about yourself  0 0 0  Trouble concentrating 0 0 0  Moving slowly or fidgety/restless 0 0 0  Suicidal thoughts 0 0 0  PHQ-9 Score 0 0 4  Difficult doing work/chores Not difficult at all Not difficult at all Not difficult at all    BP Readings from Last 3 Encounters:  05/21/24 128/78  04/05/24 130/84  12/01/23 128/74    Physical Exam Constitutional:      Appearance: Normal appearance.  HENT:     Right Ear: Tympanic membrane is retracted. Tympanic membrane is not erythematous.     Left Ear: Tympanic membrane is erythematous and retracted.     Nose:     Right Sinus: Maxillary sinus tenderness present.     Left Sinus: Maxillary sinus tenderness present.     Mouth/Throat:     Pharynx: No oropharyngeal exudate or posterior oropharyngeal erythema.   Cardiovascular:     Rate and Rhythm: Normal rate and regular rhythm.     Heart sounds: No murmur heard. Pulmonary:     Effort: Pulmonary effort is normal.     Breath sounds: Normal breath sounds. No wheezing or rhonchi.   Musculoskeletal:     Cervical back: Normal range of motion.  Lymphadenopathy:     Cervical: No cervical adenopathy.   Neurological:     Mental Status: He is alert.     Wt Readings from Last 3 Encounters:  05/21/24 194 lb (88 kg)  04/05/24 196 lb 8 oz (89.1 kg)  12/01/23 187 lb (84.8 kg)    BP 128/78    Pulse 91   Temp (!) 97.4 F (36.3 C) (Oral)   Ht 5' 9 (1.753 m)   Wt 194 lb (88 kg)   SpO2 96%   BMI 28.65 kg/m   Assessment and Plan:  Problem List Items Addressed This Visit   None Visit Diagnoses  Acute non-recurrent maxillary sinusitis    -  Primary   Continue Mucinex and Aleve  add Sudafed 30 mg bid x 5 days push fluids; Augmentin    Relevant Medications   amoxicillin -clavulanate (AUGMENTIN ) 875-125 MG tablet       No follow-ups on file.    Leita HILARIO Adie, MD [facility] [place]

## 2024-05-22 ENCOUNTER — Other Ambulatory Visit: Payer: Self-pay | Admitting: Internal Medicine

## 2024-05-22 DIAGNOSIS — L409 Psoriasis, unspecified: Secondary | ICD-10-CM

## 2024-05-23 NOTE — Telephone Encounter (Signed)
 Requested medication (s) are due for refill today: Yes  Requested medication (s) are on the active medication list: Yes  Last refill:  12/22/23  Future visit scheduled: Yes  Notes to clinic:  Unable to refill per protocol, cannot delegate.      Requested Prescriptions  Pending Prescriptions Disp Refills   clobetasol  ointment (TEMOVATE ) 0.05 % [Pharmacy Med Name: CLOBETASOL  0.05% OINTMENT] 60 g 0    Sig: APPLY TO AFFECTED AREA TWICE A DAY     Not Delegated - Dermatology:  Corticosteroids Failed - 05/23/2024  3:04 PM      Failed - This refill cannot be delegated      Failed - Valid encounter within last 12 months    Recent Outpatient Visits           2 days ago Acute non-recurrent maxillary sinusitis   [place] [facility] at [facility], Leita DEL, MD   1 month ago Hemangioma of face   [facility] at [facility], Leita DEL, MD       Future Appointments             In 3 weeks Justus Leita DEL, MD [facility] at [facility], [facility]

## 2024-06-19 ENCOUNTER — Ambulatory Visit (INDEPENDENT_AMBULATORY_CARE_PROVIDER_SITE_OTHER): Admitting: Internal Medicine

## 2024-06-19 ENCOUNTER — Encounter: Payer: Self-pay | Admitting: Internal Medicine

## 2024-06-19 VITALS — BP 118/74 | HR 50 | Ht 69.0 in | Wt 193.0 lb

## 2024-06-19 DIAGNOSIS — Z125 Encounter for screening for malignant neoplasm of prostate: Secondary | ICD-10-CM

## 2024-06-19 DIAGNOSIS — K219 Gastro-esophageal reflux disease without esophagitis: Secondary | ICD-10-CM

## 2024-06-19 DIAGNOSIS — Z0000 Encounter for general adult medical examination without abnormal findings: Secondary | ICD-10-CM | POA: Diagnosis not present

## 2024-06-19 DIAGNOSIS — Z131 Encounter for screening for diabetes mellitus: Secondary | ICD-10-CM

## 2024-06-19 DIAGNOSIS — G5763 Lesion of plantar nerve, bilateral lower limbs: Secondary | ICD-10-CM | POA: Insufficient documentation

## 2024-06-19 DIAGNOSIS — Z13220 Encounter for screening for lipoid disorders: Secondary | ICD-10-CM | POA: Diagnosis not present

## 2024-06-19 DIAGNOSIS — L409 Psoriasis, unspecified: Secondary | ICD-10-CM

## 2024-06-19 NOTE — Assessment & Plan Note (Signed)
 Fairly well controlled on topical agents Seeing Hester - may need to go back if worsening

## 2024-06-19 NOTE — Assessment & Plan Note (Signed)
 Reflux symptoms are controlled on diet changes. Patient denies red flag symptoms - no melena, weight loss, dysphagia.

## 2024-06-19 NOTE — Progress Notes (Signed)
 Date:  06/19/2024   Name:  Christopher Harrington   DOB:  March 01, 1981   MRN:  [id number]   Chief Complaint: Annual Exam Christopher Harrington is a 43 y.o. male who presents today for his Complete Annual Exam. He feels fairly well. He reports exercising- none. He reports he is sleeping poorly.   Health Maintenance  Topic Date Due   Hepatitis B Vaccine (1 of 3 - 19+ 3-dose series) Never done   HPV Vaccine (1 - 3-dose SCDM series) Never done   COVID-19 Vaccine (2 - 2024-25 season) 07/30/2023   Flu Shot  06/28/2024   DTaP/Tdap/Td vaccine (2 - Td or Tdap) 03/25/2025   Colon Cancer Screening  03/15/2033   Hepatitis C Screening  Completed   HIV Screening  Completed   Meningitis B Vaccine  Aged Out    No results found for: PSA1, PSA  Foot Injury  There was no injury mechanism. The pain is present in the left foot and right foot. The quality of the pain is described as aching and burning. The pain is moderate. The pain has been Fluctuating since onset. Associated symptoms comments: Seen by podiatry - injected for Morton's neuroma but not much better.    Review of Systems  Constitutional:  Negative for fatigue and unexpected weight change.  HENT:  Negative for nosebleeds.   Eyes:  Negative for visual disturbance.  Respiratory:  Negative for cough, chest tightness, shortness of breath and wheezing.   Cardiovascular:  Negative for chest pain, palpitations and leg swelling.  Gastrointestinal:  Negative for abdominal pain, constipation and diarrhea.  Musculoskeletal:  Positive for arthralgias (both feet - morton's neuroma) and gait problem.  Neurological:  Negative for dizziness, weakness, light-headedness and headaches.  Psychiatric/Behavioral:  Negative for dysphoric mood and sleep disturbance. The patient is not nervous/anxious.      Lab Results  Component Value Date   NA 139 05/30/2023   K 4.2 05/30/2023   CO2 26 05/30/2023   GLUCOSE 85 05/30/2023   BUN 16 05/30/2023   CREATININE  1.06 05/30/2023   CALCIUM 9.4 05/30/2023   EGFR 89 05/30/2023   GFRNONAA >60 02/02/2022   Lab Results  Component Value Date   CHOL 146 02/08/2023   HDL 53 02/08/2023   LDLCALC 79 02/08/2023   TRIG 69 02/08/2023   CHOLHDL 2.8 02/08/2023   Lab Results  Component Value Date   TSH 2.170 02/08/2023   Lab Results  Component Value Date   HGBA1C 5.4 05/30/2023   Lab Results  Component Value Date   WBC 7.1 05/30/2023   HGB 13.6 05/30/2023   HCT 38.8 05/30/2023   MCV 87 05/30/2023   PLT 264 05/30/2023   Lab Results  Component Value Date   ALT 12 05/30/2023   AST 14 05/30/2023   ALKPHOS 73 05/30/2023   BILITOT 0.5 05/30/2023   No results found for: MARIEN BOLLS, VD25OH   Patient Active Problem List   Diagnosis Date Noted   Morton's neuroma of both feet 06/19/2024   Chronic foot pain 04/05/2024   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   Sebaceous cyst 08/08/2022   Lumbosacral spondylosis with radiculopathy 06/30/2022   Impingement syndrome of shoulder region 05/08/2020   Chronic constipation 01/07/2020   Eustachian tube dysfunction, bilateral 04/01/2019   Psoriasis 10/16/2017   History of recurrent ear infection 10/16/2017   Anxiety 03/26/2015   GERD without esophagitis 03/26/2015    No Known Allergies  Past Surgical History:  Procedure Laterality Date  COLONOSCOPY WITH PROPOFOL  N/A 03/16/2023   Procedure: COLONOSCOPY WITH PROPOFOL ;  Surgeon: Unk Corinn Skiff, MD;  Location: [facility];  Service: Endoscopy;  Laterality: N/A;    Social History   Tobacco Use   Smoking status: Never   Smokeless tobacco: Never  Vaping Use   Vaping status: Never Used  Substance Use Topics   Alcohol use: Yes    Comment: Drink once every few months   Drug use: No     Medication list has been reviewed and updated.  Current Meds  Medication Sig   clobetasol  ointment (TEMOVATE ) 0.05 % APPLY TO AFFECTED AREA TWICE A DAY   fluticasone (FLONASE) 50 MCG/ACT  nasal spray Place daily into both nostrils.   Tapinarof  (VTAMA ) 1 % CREA Apply 1 application  topically daily. Qd to aa psoriasis on body and in scalp   UNABLE TO FIND Med Name: lions mane OTC       06/19/2024    8:21 AM 05/21/2024    2:54 PM 04/05/2024    2:42 PM 05/30/2023    4:14 PM  GAD 7 : Generalized Anxiety Score  Nervous, Anxious, on Edge 0 0 0 0  Control/stop worrying 1 0 0 0  Worry too much - different things 1 0 0 0  Trouble relaxing 0 0 0 0  Restless 0 0 0 0  Easily annoyed or irritable 2 0 0 1  Afraid - awful might happen 0 0 0 0  Total GAD 7 Score 4 0 0 1  Anxiety Difficulty Not difficult at all Not difficult at all Not difficult at all Not difficult at all       06/19/2024    8:20 AM 05/21/2024    2:54 PM 04/05/2024    2:41 PM  Depression screen PHQ 2/9  Decreased Interest 0 0 0  Down, Depressed, Hopeless 0 0 0  PHQ - 2 Score 0 0 0  Altered sleeping 1 0 0  Tired, decreased energy 1 0 0  Change in appetite 2 0 0  Feeling bad or failure about yourself  0 0 0  Trouble concentrating 0 0 0  Moving slowly or fidgety/restless 0 0 0  Suicidal thoughts 0 0 0  PHQ-9 Score 4 0 0  Difficult doing work/chores Not difficult at all Not difficult at all Not difficult at all    BP Readings from Last 3 Encounters:  06/19/24 118/74  05/21/24 128/78  04/05/24 130/84    Physical Exam Vitals and nursing note reviewed.  Constitutional:      Appearance: Normal appearance. He is well-developed.  HENT:     Head: Normocephalic.     Right Ear: Tympanic membrane, ear canal and external ear normal.     Left Ear: Tympanic membrane, ear canal and external ear normal.     Nose: Nose normal.  Eyes:     Conjunctiva/sclera: Conjunctivae normal.     Pupils: Pupils are equal, round, and reactive to light.  Neck:     Thyroid: No thyromegaly.     Vascular: No carotid bruit.  Cardiovascular:     Rate and Rhythm: Normal rate and regular rhythm.     Heart sounds: Normal heart sounds.   Pulmonary:     Effort: Pulmonary effort is normal.     Breath sounds: Normal breath sounds. No wheezing.  Chest:  Breasts:    Right: No mass.     Left: No mass.  Abdominal:     General: Bowel sounds are normal.  Palpations: Abdomen is soft.     Tenderness: There is no abdominal tenderness.  Musculoskeletal:        General: Normal range of motion.     Cervical back: Normal range of motion and neck supple.     Right lower leg: No edema.     Left lower leg: No edema.  Lymphadenopathy:     Cervical: No cervical adenopathy.  Skin:    General: Skin is warm and dry.     Capillary Refill: Capillary refill takes less than 2 seconds.     Findings: Rash (scattered lesions on arms, legs and trunk c/w psoriasis) present.  Neurological:     General: No focal deficit present.     Mental Status: He is alert and oriented to person, place, and time.     Deep Tendon Reflexes: Reflexes are normal and symmetric.  Psychiatric:        Attention and Perception: Attention normal.        Mood and Affect: Mood normal.        Thought Content: Thought content normal.     Wt Readings from Last 3 Encounters:  06/19/24 193 lb (87.5 kg)  05/21/24 194 lb (88 kg)  04/05/24 196 lb 8 oz (89.1 kg)    BP 118/74   Pulse (!) 50   Ht 5' 9 (1.753 m)   Wt 193 lb (87.5 kg)   SpO2 97%   BMI 28.50 kg/m   Assessment and Plan:  Problem List Items Addressed This Visit       Unprioritized   GERD without esophagitis (Chronic)   Reflux symptoms are controlled on diet changes. Patient denies red flag symptoms - no melena, weight loss, dysphagia.       Relevant Orders   CBC with Differential/Platelet   Psoriasis   Fairly well controlled on topical agents Seeing Hester - may need to go back if worsening      Morton's neuroma of both feet   On going pain despite injections Recommend he return to Podiatry for further evaluation      Other Visit Diagnoses       Annual physical exam    -   Primary   CRC screening completed last year immunizations up to date   Relevant Orders   CBC with Differential/Platelet   Comprehensive metabolic panel with GFR   Hemoglobin A1c   Lipid panel   PSA     Prostate cancer screening       Relevant Orders   PSA     Screening for diabetes mellitus       Relevant Orders   Comprehensive metabolic panel with GFR   Hemoglobin A1c     Screening for lipid disorders       Relevant Orders   Lipid panel       Return in about 1 year (around 06/19/2025) for CPX with Rolan.    Leita HILARIO Adie, MD [facility] [place]

## 2024-06-19 NOTE — Assessment & Plan Note (Signed)
 On going pain despite injections Recommend he return to Podiatry for further evaluation

## 2024-06-20 ENCOUNTER — Ambulatory Visit: Payer: Self-pay | Admitting: Internal Medicine

## 2024-06-20 LAB — CBC WITH DIFFERENTIAL/PLATELET
Basophils Absolute: 0 x10E3/uL (ref 0.0–0.2)
Basos: 0 %
EOS (ABSOLUTE): 0.1 x10E3/uL (ref 0.0–0.4)
Eos: 1 %
Hematocrit: 42.4 % (ref 37.5–51.0)
Hemoglobin: 14.4 g/dL (ref 13.0–17.7)
Immature Grans (Abs): 0 x10E3/uL (ref 0.0–0.1)
Immature Granulocytes: 0 %
Lymphocytes Absolute: 2.3 x10E3/uL (ref 0.7–3.1)
Lymphs: 31 %
MCH: 30.6 pg (ref 26.6–33.0)
MCHC: 34 g/dL (ref 31.5–35.7)
MCV: 90 fL (ref 79–97)
Monocytes Absolute: 0.6 x10E3/uL (ref 0.1–0.9)
Monocytes: 8 %
Neutrophils Absolute: 4.3 x10E3/uL (ref 1.4–7.0)
Neutrophils: 60 %
Platelets: 247 x10E3/uL (ref 150–450)
RBC: 4.71 x10E6/uL (ref 4.14–5.80)
RDW: 13.1 % (ref 11.6–15.4)
WBC: 7.2 x10E3/uL (ref 3.4–10.8)

## 2024-06-20 LAB — COMPREHENSIVE METABOLIC PANEL WITH GFR
ALT: 11 IU/L (ref 0–44)
AST: 13 IU/L (ref 0–40)
Albumin: 4.6 g/dL (ref 4.1–5.1)
Alkaline Phosphatase: 70 IU/L (ref 44–121)
BUN/Creatinine Ratio: 13 (ref 9–20)
BUN: 15 mg/dL (ref 6–24)
Bilirubin Total: 0.5 mg/dL (ref 0.0–1.2)
CO2: 22 mmol/L (ref 20–29)
Calcium: 9.5 mg/dL (ref 8.7–10.2)
Chloride: 99 mmol/L (ref 96–106)
Creatinine, Ser: 1.13 mg/dL (ref 0.76–1.27)
Globulin, Total: 2.6 g/dL (ref 1.5–4.5)
Glucose: 94 mg/dL (ref 70–99)
Potassium: 4.6 mmol/L (ref 3.5–5.2)
Sodium: 138 mmol/L (ref 134–144)
Total Protein: 7.2 g/dL (ref 6.0–8.5)
eGFR: 82 mL/min/1.73 (ref >59)

## 2024-06-20 LAB — HEMOGLOBIN A1C
Est. average glucose Bld gHb Est-mCnc: 114 mg/dL
Hgb A1c MFr Bld: 5.6 % (ref 4.8–5.6)

## 2024-06-20 LAB — LIPID PANEL
Chol/HDL Ratio: 2.6 ratio (ref 0.0–5.0)
Cholesterol, Total: 174 mg/dL (ref 100–199)
HDL: 68 mg/dL (ref >39)
LDL Chol Calc (NIH): 96 mg/dL (ref 0–99)
Triglycerides: 49 mg/dL (ref 0–149)
VLDL Cholesterol Cal: 10 mg/dL (ref 5–40)

## 2024-06-20 LAB — PSA: Prostate Specific Ag, Serum: 1 ng/mL (ref 0.0–4.0)

## 2024-07-30 ENCOUNTER — Encounter: Payer: Self-pay | Admitting: Internal Medicine

## 2024-07-31 ENCOUNTER — Telehealth: Admitting: Internal Medicine

## 2024-07-31 ENCOUNTER — Encounter: Payer: Self-pay | Admitting: Internal Medicine

## 2024-07-31 VITALS — Ht 69.0 in

## 2024-07-31 DIAGNOSIS — U071 COVID-19: Secondary | ICD-10-CM | POA: Diagnosis not present

## 2024-07-31 MED ORDER — PROMETHAZINE-DM 6.25-15 MG/5ML PO SYRP
5.0000 mL | ORAL_SOLUTION | Freq: Four times a day (QID) | ORAL | 0 refills | Status: AC | PRN
Start: 2024-07-31 — End: 2024-08-09

## 2024-07-31 NOTE — Patient Instructions (Signed)
 Take Tylenol  regularly while you are sick. You can also take sinus medications if needed for head and ear congestion. Drink plenty of fluids. Quarantine for 5 days and return to work on Monday 89/8/25.

## 2024-07-31 NOTE — Progress Notes (Signed)
 Date:  07/31/2024   Name:  Christopher Harrington   DOB:  [birth year]   MRN:  [id number]  This encounter was conducted via video encounter. This platform was deemed appropriate for the issues to be addressed.  The patient was correctly identified.  I advised that I am conducting the visit from a secure room in my office at [facility] clinic.  The patient is located at home. The limitations of this form of encounter were discussed with the patient and he/she agreed to proceed.  Some vital signs will be absent.  Chief Complaint: Covid Positive (Positive test yesterday, low grade fever, muscle/ body aches, sore throat, cough and congestion, brain fog, symptoms started Sunday X3 days ago )  Cough This is a new problem. Episode onset: 3 days ago. The problem has been gradually worsening. The cough is Non-productive. Associated symptoms include chills, a fever, headaches and nasal congestion. Pertinent negatives include no chest pain, shortness of breath or wheezing.    Review of Systems  Constitutional:  Positive for chills and fever.  HENT:  Positive for congestion and sinus pressure.   Respiratory:  Positive for cough. Negative for chest tightness, shortness of breath and wheezing.   Cardiovascular:  Negative for chest pain and palpitations.  Neurological:  Positive for headaches. Negative for dizziness and light-headedness.     Lab Results  Component Value Date   NA 138 06/19/2024   K 4.6 06/19/2024   CO2 22 06/19/2024   GLUCOSE 94 06/19/2024   BUN 15 06/19/2024   CREATININE 1.13 06/19/2024   CALCIUM 9.5 06/19/2024   EGFR 82 06/19/2024   GFRNONAA >60 02/02/2022   Lab Results  Component Value Date   CHOL 174 06/19/2024   HDL 68 06/19/2024   LDLCALC 96 06/19/2024   TRIG 49 06/19/2024   CHOLHDL 2.6 06/19/2024   Lab Results  Component Value Date   TSH 2.170 02/08/2023   Lab Results  Component Value Date   HGBA1C 5.6 06/19/2024   Lab Results  Component Value Date   WBC  7.2 06/19/2024   HGB 14.4 06/19/2024   HCT 42.4 06/19/2024   MCV 90 06/19/2024   PLT 247 06/19/2024   Lab Results  Component Value Date   ALT 11 06/19/2024   AST 13 06/19/2024   ALKPHOS 70 06/19/2024   BILITOT 0.5 06/19/2024   No results found for: MARIEN BOLLS, VD25OH   Patient Active Problem List   Diagnosis Date Noted   Morton's neuroma of both feet 06/19/2024   Chronic foot pain 04/05/2024   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   Sebaceous cyst 08/08/2022   Lumbosacral spondylosis with radiculopathy 06/30/2022   Impingement syndrome of shoulder region 05/08/2020   Chronic constipation 01/07/2020   Eustachian tube dysfunction, bilateral 04/01/2019   Psoriasis 10/16/2017   History of recurrent ear infection 10/16/2017   Anxiety 03/26/2015   GERD without esophagitis 03/26/2015    No Known Allergies  Past Surgical History:  Procedure Laterality Date   COLONOSCOPY WITH PROPOFOL  N/A 03/16/2023   Procedure: COLONOSCOPY WITH PROPOFOL ;  Surgeon: Unk Corinn Skiff, MD;  Location: [facility];  Service: Endoscopy;  Laterality: N/A;    Social History   Tobacco Use   Smoking status: Never   Smokeless tobacco: Never  Vaping Use   Vaping status: Never Used  Substance Use Topics   Alcohol use: Yes    Comment: Drink once every few months   Drug use: No     Medication list  has been reviewed and updated.  Current Meds  Medication Sig   clobetasol  ointment (TEMOVATE ) 0.05 % APPLY TO AFFECTED AREA TWICE A DAY   fluticasone (FLONASE) 50 MCG/ACT nasal spray Place daily into both nostrils.   promethazine -dextromethorphan (PROMETHAZINE -DM) 6.25-15 MG/5ML syrup Take 5 mLs by mouth 4 (four) times daily as needed for up to 9 days for cough.   Tapinarof  (VTAMA ) 1 % CREA Apply 1 application  topically daily. Qd to aa psoriasis on body and in scalp   UNABLE TO FIND Med Name: lions mane OTC       07/31/2024    3:22 PM 06/19/2024    8:21 AM 05/21/2024    2:54 PM  04/05/2024    2:42 PM  GAD 7 : Generalized Anxiety Score  Nervous, Anxious, on Edge 0 0 0 0  Control/stop worrying 0 1 0 0  Worry too much - different things 0 1 0 0  Trouble relaxing 0 0 0 0  Restless 0 0 0 0  Easily annoyed or irritable 0 2 0 0  Afraid - awful might happen 0 0 0 0  Total GAD 7 Score 0 4 0 0  Anxiety Difficulty Not difficult at all Not difficult at all Not difficult at all Not difficult at all       07/31/2024    3:22 PM 06/19/2024    8:20 AM 05/21/2024    2:54 PM  Depression screen PHQ 2/9  Decreased Interest 0 0 0  Down, Depressed, Hopeless 0 0 0  PHQ - 2 Score 0 0 0  Altered sleeping  1 0  Tired, decreased energy  1 0  Change in appetite  2 0  Feeling bad or failure about yourself   0 0  Trouble concentrating  0 0  Moving slowly or fidgety/restless  0 0  Suicidal thoughts  0 0  PHQ-9 Score  4 0  Difficult doing work/chores  Not difficult at all Not difficult at all    BP Readings from Last 3 Encounters:  06/19/24 118/74  05/21/24 128/78  04/05/24 130/84    Physical Exam Vitals and nursing note reviewed.  Constitutional:      General: He is not in acute distress.    Appearance: He is well-developed. He is ill-appearing.  HENT:     Head: Normocephalic and atraumatic.  Pulmonary:     Effort: Pulmonary effort is normal. No respiratory distress.  Skin:    General: Skin is warm and dry.     Findings: No rash.  Neurological:     Mental Status: He is alert and oriented to person, place, and time.  Psychiatric:        Mood and Affect: Mood normal.        Behavior: Behavior normal.     Wt Readings from Last 3 Encounters:  06/19/24 193 lb (87.5 kg)  05/21/24 194 lb (88 kg)  04/05/24 196 lb 8 oz (89.1 kg)    Ht 5' 9 (1.753 m)   BMI 28.50 kg/m   Assessment and Plan:  Problem List Items Addressed This Visit   None Visit Diagnoses       COVID-19    -  Primary   usual precautions and quarantine instructions provided. Note for work  written. Promethazine  DM for cough prescribed.   Relevant Medications   promethazine -dextromethorphan (PROMETHAZINE -DM) 6.25-15 MG/5ML syrup      I spent 7 minutes on this encounter, 100% by video. No follow-ups on file.  Leita HILARIO Adie, MD [facility] [place]

## 2024-08-13 ENCOUNTER — Encounter: Payer: Self-pay | Admitting: Internal Medicine

## 2024-08-13 NOTE — Telephone Encounter (Signed)
 Pt response.  KP

## 2024-08-13 NOTE — Telephone Encounter (Signed)
 Please review.  KP

## 2024-08-14 ENCOUNTER — Ambulatory Visit: Admitting: Physician Assistant

## 2024-08-14 ENCOUNTER — Encounter: Payer: Self-pay | Admitting: Physician Assistant

## 2024-08-14 VITALS — BP 112/82 | HR 73 | Temp 98.4°F | Ht 69.0 in | Wt 196.0 lb

## 2024-08-14 DIAGNOSIS — R052 Subacute cough: Secondary | ICD-10-CM

## 2024-08-14 MED ORDER — BENZONATATE 200 MG PO CAPS
200.0000 mg | ORAL_CAPSULE | Freq: Three times a day (TID) | ORAL | 0 refills | Status: AC | PRN
Start: 2024-08-14 — End: ?

## 2024-08-14 MED ORDER — GUAIFENESIN-CODEINE 100-10 MG/5ML PO SOLN
5.0000 mL | Freq: Three times a day (TID) | ORAL | 0 refills | Status: AC | PRN
Start: 2024-08-14 — End: ?

## 2024-08-14 NOTE — Progress Notes (Signed)
 Date:  08/14/2024   Name:  Christopher Harrington   DOB:  September 02, 1981   MRN:  [id number]   Chief Complaint: Cough  Cough This is a new problem. Episode onset: X3 days. The problem has been unchanged. The problem occurs every few minutes. The cough is Productive of sputum. Associated symptoms include ear pain, headaches, nasal congestion and postnasal drip. Treatments tried: mucinex . The treatment provided mild relief.   Humzah presents today for evaluation of persisting cough after recent COVID-19 infection about 2 weeks ago.  Typically sees my colleague Dr. Leita Adie.  Promethazine  cough syrup not very helpful.  Sleep is poor as a result, had to miss work today because he did not sleep well at all last night.  He started taking Mucinex  yesterday and reports that his dry cough has become productive as a result, says this is helping him free some mucus from his chest and sinuses.  He does mention left-sided ear pain and would like to make sure there is not an infection there.   Medication list has been reviewed and updated.  Current Meds  Medication Sig   benzonatate  (TESSALON ) 200 MG capsule Take 1 capsule (200 mg total) by mouth 3 (three) times daily as needed for cough.   clobetasol  ointment (TEMOVATE ) 0.05 % APPLY TO AFFECTED AREA TWICE A DAY   fluticasone (FLONASE) 50 MCG/ACT nasal spray Place daily into both nostrils.   guaiFENesin -codeine  100-10 MG/5ML syrup Take 5 mLs by mouth 3 (three) times daily as needed for cough.   Tapinarof  (VTAMA ) 1 % CREA Apply 1 application  topically daily. Qd to aa psoriasis on body and in scalp   UNABLE TO FIND Med Name: lions mane OTC     Review of Systems  HENT:  Positive for ear pain and postnasal drip.   Respiratory:  Positive for cough.   Neurological:  Positive for headaches.    Patient Active Problem List   Diagnosis Date Noted   Morton's neuroma of both feet 06/19/2024   Chronic foot pain 04/05/2024   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    Sebaceous cyst 08/08/2022   Lumbosacral spondylosis with radiculopathy 06/30/2022   Impingement syndrome of shoulder region 05/08/2020   Chronic constipation 01/07/2020   Eustachian tube dysfunction, bilateral 04/01/2019   Psoriasis 10/16/2017   History of recurrent ear infection 10/16/2017   Anxiety 03/26/2015   GERD without esophagitis 03/26/2015    No Known Allergies  Immunization History  Administered Date(s) Administered   Janssen (J&J) SARS-COV-2 Vaccination 06/15/2020   Tdap 03/26/2015    Past Surgical History:  Procedure Laterality Date   COLONOSCOPY WITH PROPOFOL  N/A 03/16/2023   Procedure: COLONOSCOPY WITH PROPOFOL ;  Surgeon: Unk Corinn Skiff, MD;  Location: [facility];  Service: Endoscopy;  Laterality: N/A;    Social History   Tobacco Use   Smoking status: Never   Smokeless tobacco: Never  Vaping Use   Vaping status: Never Used  Substance Use Topics   Alcohol use: Yes    Comment: Drink once every few months   Drug use: No    Family History  Problem Relation Age of Onset   Prostate cancer Neg Hx    Bladder Cancer Neg Hx    Kidney cancer Neg Hx         07/31/2024    3:22 PM 06/19/2024    8:21 AM 05/21/2024    2:54 PM 04/05/2024    2:42 PM  GAD 7 : Generalized Anxiety Score  Nervous,  Anxious, on Edge 0 0 0 0  Control/stop worrying 0 1 0 0  Worry too much - different things 0 1 0 0  Trouble relaxing 0 0 0 0  Restless 0 0 0 0  Easily annoyed or irritable 0 2 0 0  Afraid - awful might happen 0 0 0 0  Total GAD 7 Score 0 4 0 0  Anxiety Difficulty Not difficult at all Not difficult at all Not difficult at all Not difficult at all       07/31/2024    3:22 PM 06/19/2024    8:20 AM 05/21/2024    2:54 PM  Depression screen PHQ 2/9  Decreased Interest 0 0 0  Down, Depressed, Hopeless 0 0 0  PHQ - 2 Score 0 0 0  Altered sleeping  1 0  Tired, decreased energy  1 0  Change in appetite  2 0  Feeling bad or failure about yourself   0 0  Trouble  concentrating  0 0  Moving slowly or fidgety/restless  0 0  Suicidal thoughts  0 0  PHQ-9 Score  4 0  Difficult doing work/chores  Not difficult at all Not difficult at all    BP Readings from Last 3 Encounters:  08/14/24 112/82  06/19/24 118/74  05/21/24 128/78    Wt Readings from Last 3 Encounters:  08/14/24 196 lb (88.9 kg)  06/19/24 193 lb (87.5 kg)  05/21/24 194 lb (88 kg)    BP 112/82   Pulse 73   Temp 98.4 F (36.9 C)   Ht 5' 9 (1.753 m)   Wt 196 lb (88.9 kg)   SpO2 96%   BMI 28.94 kg/m   Physical Exam Vitals and nursing note reviewed.  Constitutional:      General: He is not in acute distress.    Appearance: Normal appearance.  HENT:     Right Ear: Tympanic membrane normal.     Left Ear: Tympanic membrane normal.     Ears:     Comments: EAC clear bilaterally with good view of TM which is without effusion or erythema.     Nose: Nose normal.     Comments: Sinuses nontender    Mouth/Throat:     Mouth: Mucous membranes are moist.     Pharynx: No oropharyngeal exudate or posterior oropharyngeal erythema.  Eyes:     Conjunctiva/sclera: Conjunctivae normal.     Pupils: Pupils are equal, round, and reactive to light.  Cardiovascular:     Rate and Rhythm: Normal rate and regular rhythm.     Heart sounds: No murmur heard.    No friction rub. No gallop.  Pulmonary:     Effort: Pulmonary effort is normal.     Breath sounds: Normal breath sounds. No wheezing, rhonchi or rales.  Lymphadenopathy:     Cervical: No cervical adenopathy.     Recent Labs     Component Value Date/Time   NA 138 06/19/2024 0925   K 4.6 06/19/2024 0925   CL 99 06/19/2024 0925   CO2 22 06/19/2024 0925   GLUCOSE 94 06/19/2024 0925   GLUCOSE 120 (H) 02/02/2022 0813   BUN 15 06/19/2024 0925   CREATININE 1.13 06/19/2024 0925   CALCIUM 9.5 06/19/2024 0925   PROT 7.2 06/19/2024 0925   ALBUMIN 4.6 06/19/2024 0925   AST 13 06/19/2024 0925   ALT 11 06/19/2024 0925   ALKPHOS 70  06/19/2024 0925   BILITOT 0.5 06/19/2024 0925   GFRNONAA >60 02/02/2022  0813   GFRAA 94 12/30/2020 1411    Lab Results  Component Value Date   WBC 7.2 06/19/2024   HGB 14.4 06/19/2024   HCT 42.4 06/19/2024   MCV 90 06/19/2024   PLT 247 06/19/2024   Lab Results  Component Value Date   HGBA1C 5.6 06/19/2024   HGBA1C 5.4 05/30/2023   HGBA1C 5.8 (H) 02/08/2023   Lab Results  Component Value Date   CHOL 174 06/19/2024   HDL 68 06/19/2024   LDLCALC 96 06/19/2024   TRIG 49 06/19/2024   CHOLHDL 2.6 06/19/2024   Lab Results  Component Value Date   TSH 2.170 02/08/2023      Assessment and Plan:  1. Subacute cough (Primary) Exam is quite reassuring today.  I think patient would improve rapidly if he could get better sleep.  Sending benzonatate  and cough syrup with codeine  to help with symptomatic control.  Discussed that codeine  is sedating, and he should try not to use it during the day.  He verbalizes understanding.  Encouraged adequate hydration, especially while using Mucinex .  - benzonatate  (TESSALON ) 200 MG capsule; Take 1 capsule (200 mg total) by mouth 3 (three) times daily as needed for cough.  Dispense: 30 capsule; Refill: 0 - guaiFENesin -codeine  100-10 MG/5ML syrup; Take 5 mLs by mouth 3 (three) times daily as needed for cough.  Dispense: 120 mL; Refill: 0    No follow-ups on file.    Rolan Hoyle, PA-C, DMSc, Nutritionist [facility] [phone]

## 2024-08-26 ENCOUNTER — Ambulatory Visit: Admitting: Podiatry

## 2024-08-26 VITALS — Ht 69.0 in | Wt 196.0 lb

## 2024-08-26 DIAGNOSIS — L03032 Cellulitis of left toe: Secondary | ICD-10-CM

## 2024-08-26 MED ORDER — CEPHALEXIN 500 MG PO CAPS: 500.0000 mg | ORAL_CAPSULE | Freq: Four times a day (QID) | ORAL | 0 refills | Status: AC

## 2024-08-27 ENCOUNTER — Encounter: Payer: Self-pay | Admitting: Podiatry

## 2024-08-27 NOTE — Progress Notes (Signed)
  Subjective:  Patient ID: Christopher Harrington, male    DOB: 21-Aug-1981,  MRN: [id number]  Chief Complaint  Patient presents with   Toe Pain    RM 7 Swollen red area over my big toe possibly infection of left hallux.     43 y.o. male presents with the above complaint. History confirmed with patient.  Erythema and swelling around the nailbed of the left hallux  Objective:  Physical Exam: warm, good capillary refill, no trophic changes or ulcerative lesions, normal DP and PT pulses, normal sensory exam, and ingrown left hallux nail with paronychia.    Assessment:   1. Paronychia of great toe of left foot      Plan:  Patient was evaluated and treated and all questions answered.  Discussed treatment options including soaking antibiotic therapy and removal of the nail plate which I recommended today.  He preferred to treat with antibiotic therapy initially to see if this will resolve it.  Rx sent to pharmacy for cephalexin.  Follow-up in 2.5 weeks to reevaluate if no improvement would recommend nail avulsion at that point.  Return in about 16 days (around 09/11/2024) for nail infection f/u .

## 2024-08-29 ENCOUNTER — Encounter: Payer: Self-pay | Admitting: Internal Medicine

## 2024-09-11 ENCOUNTER — Ambulatory Visit: Admitting: Podiatry

## 2024-09-11 VITALS — Ht 69.0 in | Wt 196.0 lb

## 2024-09-11 DIAGNOSIS — L03032 Cellulitis of left toe: Secondary | ICD-10-CM | POA: Diagnosis not present

## 2024-09-12 MED ORDER — CEPHALEXIN 500 MG PO CAPS: 500.0000 mg | ORAL_CAPSULE | Freq: Three times a day (TID) | ORAL | 0 refills | Status: AC

## 2024-09-12 NOTE — Progress Notes (Signed)
  Subjective:  Patient ID: Christopher Harrington, male    DOB: 07-14-1981,  MRN: [id number]  Chief Complaint  Patient presents with   Nail Problem    Rm 4 Patient is here to f/u on Paronychia of great toe of left foot. Toe is red and swollen on medial side, no warmness. The nail is discolored at the tip and patient just completed antibiotics.    43 y.o. male presents with the above complaint. History confirmed with patient.  He notes improvement with antibiotics has not resolved fully but is no longer painful  Objective:  Physical Exam: warm, good capillary refill, no trophic changes or ulcerative lesions, normal DP and PT pulses, normal sensory exam, and ingrown left hallux nail with paronychia with significant improvement.    Assessment:   1. Paronychia of great toe of left foot      Plan:  Patient was evaluated and treated and all questions answered.  Doing better.  I discussed another round of cephalexin and sent this to his pharmacy.  Follow-up in 4 weeks to reevaluate sooner if it becomes infected.  Return in about 4 weeks (around 10/09/2024) for ingrown follow up .

## 2024-10-01 ENCOUNTER — Other Ambulatory Visit: Payer: Self-pay | Admitting: Internal Medicine

## 2024-10-01 DIAGNOSIS — L409 Psoriasis, unspecified: Secondary | ICD-10-CM

## 2024-10-02 NOTE — Telephone Encounter (Signed)
 Requested medication (s) are due for refill today: yes  Requested medication (s) are on the active medication list: yes  Last refill:  05/23/24  Future visit scheduled: no  Notes to clinic:  Unable to refill per protocol, cannot delegate.      Requested Prescriptions  Pending Prescriptions Disp Refills   clobetasol  ointment (TEMOVATE ) 0.05 % [Pharmacy Med Name: CLOBETASOL  0.05% OINTMENT] 60 g 0    Sig: APPLY TO AFFECTED AREA TWICE A DAY     Not Delegated - Dermatology:  Corticosteroids Failed - 10/02/2024  4:14 PM      Failed - This refill cannot be delegated      Passed - Valid encounter within last 12 months    Recent Outpatient Visits           1 month ago Subacute cough   [facility], Toribio SQUIBB, [place]   2 months ago COVID-19   [facility] at [facility], Leita DEL, [place]   3 months ago Annual physical exam   [facility] at [facility], Leita DEL, [place]   4 months ago Acute non-recurrent maxillary sinusitis   [facility] at [facility], Leita DEL, [place]   6 months ago Hemangioma of face   [facility] at [facility], Leita DEL, [place]

## 2024-10-07 ENCOUNTER — Encounter: Payer: Self-pay | Admitting: Internal Medicine

## 2024-10-09 ENCOUNTER — Encounter: Payer: Self-pay | Admitting: Podiatry

## 2024-10-09 ENCOUNTER — Ambulatory Visit: Admitting: Podiatry

## 2024-10-09 VITALS — Ht 69.0 in | Wt 196.0 lb

## 2024-10-09 DIAGNOSIS — L03032 Cellulitis of left toe: Secondary | ICD-10-CM

## 2024-10-13 NOTE — Progress Notes (Signed)
  Subjective:  Patient ID: Christopher Harrington, male    DOB: [birth year],  MRN: [id number]  Chief Complaint  Patient presents with   Ingrown Toenail    RM 3 Patient is here to  f/u on ingrown toe nail removal. Pt states some pain when pressure is placed on the left hallux while wearing work shoes or doing activities.    43 y.o. male presents with the above complaint. History confirmed with patient.  Doing much better  Objective:  Physical Exam: warm, good capillary refill, no trophic changes or ulcerative lesions, normal DP and PT pulses, normal sensory exam, and hallux nail no medial border pain and erythema improved     Assessment:   1. Paronychia of great toe of left foot       Plan:  Patient was evaluated and treated and all questions answered.  Doing better. Infection resolved at this point. F/u PRN discussed if it recurs would recommend partial perm matrixectomy  No follow-ups on file.

## 2024-10-18 ENCOUNTER — Ambulatory Visit: Admitting: Internal Medicine

## 2024-11-19 ENCOUNTER — Encounter: Payer: Self-pay | Admitting: Internal Medicine

## 2024-11-19 ENCOUNTER — Ambulatory Visit: Admitting: Internal Medicine

## 2024-11-19 VITALS — BP 118/68 | HR 72 | Ht 69.0 in | Wt 202.0 lb

## 2024-11-19 DIAGNOSIS — H6993 Unspecified Eustachian tube disorder, bilateral: Secondary | ICD-10-CM

## 2024-11-19 DIAGNOSIS — R1031 Right lower quadrant pain: Secondary | ICD-10-CM | POA: Diagnosis not present

## 2024-11-19 NOTE — Assessment & Plan Note (Signed)
 Recurrent symptoms Continue Flonase; use sudafed bid prn discomfort No evidence of infection on exam today

## 2024-11-19 NOTE — Progress Notes (Signed)
 "   Date:  11/19/2024   Name:  Christopher Harrington   DOB:  [birth year]   MRN:  [id number]   Chief Complaint: acute (Had right side pain, then hurt my back and now i have bad pain radiating from testicles/groin.--2 weeks)  Abdominal Pain This is a recurrent problem. The current episode started 1 to 4 weeks ago. The problem occurs daily. The problem has been unchanged. The pain is located in the RLQ. The pain is mild. The quality of the pain is cramping and a sensation of fullness. The abdominal pain radiates to the scrotum. Associated symptoms include anorexia and nausea. Pertinent negatives include no belching, constipation, fever or weight loss. The pain is aggravated by certain positions and eating. The pain is relieved by Nothing.  Testicle Pain The patient's primary symptoms include testicular pain. This is a new problem. The current episode started 1 to 4 weeks ago. The problem occurs daily. The problem has been gradually improving. Associated symptoms include abdominal pain, anorexia and nausea. Pertinent negatives include no chest pain, chills, constipation, fever, flank pain, painful intercourse, rash or shortness of breath. The testicular pain affects the right testicle. The color of the testicles is Normal.    Review of Systems  Constitutional:  Negative for chills, fatigue, fever and weight loss.  HENT:  Positive for ear pain (and fullness).   Respiratory:  Negative for chest tightness and shortness of breath.   Cardiovascular:  Negative for chest pain and palpitations.  Gastrointestinal:  Positive for abdominal pain, anorexia and nausea. Negative for constipation.  Genitourinary:  Positive for testicular pain. Negative for flank pain.  Skin:  Negative for rash.     Lab Results  Component Value Date   NA 138 06/19/2024   K 4.6 06/19/2024   CO2 22 06/19/2024   GLUCOSE 94 06/19/2024   BUN 15 06/19/2024   CREATININE 1.13 06/19/2024   CALCIUM 9.5 06/19/2024   EGFR 82 06/19/2024    GFRNONAA >60 02/02/2022   Lab Results  Component Value Date   CHOL 174 06/19/2024   HDL 68 06/19/2024   LDLCALC 96 06/19/2024   TRIG 49 06/19/2024   CHOLHDL 2.6 06/19/2024   Lab Results  Component Value Date   TSH 2.170 02/08/2023   Lab Results  Component Value Date   HGBA1C 5.6 06/19/2024   Lab Results  Component Value Date   WBC 7.2 06/19/2024   HGB 14.4 06/19/2024   HCT 42.4 06/19/2024   MCV 90 06/19/2024   PLT 247 06/19/2024   Lab Results  Component Value Date   ALT 11 06/19/2024   AST 13 06/19/2024   ALKPHOS 70 06/19/2024   BILITOT 0.5 06/19/2024   No results found for: MARIEN BOLLS, VD25OH   Patient Active Problem List   Diagnosis Date Noted   Morton's neuroma of both feet 06/19/2024   Chronic foot pain 04/05/2024   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   Sebaceous cyst 08/08/2022   Lumbosacral spondylosis with radiculopathy 06/30/2022   Impingement syndrome of shoulder region 05/08/2020   Chronic constipation 01/07/2020   Eustachian tube dysfunction, bilateral 04/01/2019   Psoriasis 10/16/2017   History of recurrent ear infection 10/16/2017   Anxiety 03/26/2015   GERD without esophagitis 03/26/2015    Allergies[1]  Past Surgical History:  Procedure Laterality Date   COLONOSCOPY WITH PROPOFOL  N/A 03/16/2023   Procedure: COLONOSCOPY WITH PROPOFOL ;  Surgeon: Unk Corinn Skiff, MD;  Location: [facility];  Service: Endoscopy;  Laterality: N/A;  Social History[2]   Medication list has been reviewed and updated.  Active Medications[3]     07/31/2024    3:22 PM 06/19/2024    8:21 AM 05/21/2024    2:54 PM 04/05/2024    2:42 PM  GAD 7 : Generalized Anxiety Score  Nervous, Anxious, on Edge 0 0 0 0  Control/stop worrying 0 1 0 0  Worry too much - different things 0 1 0 0  Trouble relaxing 0 0 0 0  Restless 0 0 0 0  Easily annoyed or irritable 0 2 0 0  Afraid - awful might happen 0 0 0 0  Total GAD 7 Score 0 4 0 0  Anxiety  Difficulty Not difficult at all Not difficult at all Not difficult at all Not difficult at all       07/31/2024    3:22 PM 06/19/2024    8:20 AM 05/21/2024    2:54 PM  Depression screen PHQ 2/9  Decreased Interest 0 0 0  Down, Depressed, Hopeless 0 0 0  PHQ - 2 Score 0 0 0  Altered sleeping  1 0  Tired, decreased energy  1 0  Change in appetite  2 0  Feeling bad or failure about yourself   0 0  Trouble concentrating  0 0  Moving slowly or fidgety/restless  0 0  Suicidal thoughts  0 0  PHQ-9 Score  4  0   Difficult doing work/chores  Not difficult at all Not difficult at all     Data saved with a previous flowsheet row definition    BP Readings from Last 3 Encounters:  11/19/24 118/68  08/14/24 112/82  06/19/24 118/74    Physical Exam Vitals and nursing note reviewed.  Constitutional:      General: He is not in acute distress.    Appearance: Normal appearance. He is well-developed.  HENT:     Head: Normocephalic and atraumatic.     Right Ear: No middle ear effusion. Tympanic membrane is retracted. Tympanic membrane is not erythematous.     Left Ear:  No middle ear effusion. Tympanic membrane is retracted. Tympanic membrane is not erythematous.  Cardiovascular:     Rate and Rhythm: Normal rate and regular rhythm.  Pulmonary:     Effort: Pulmonary effort is normal. No respiratory distress.     Breath sounds: No wheezing or rhonchi.  Abdominal:     General: Abdomen is flat.     Palpations: Abdomen is soft.     Tenderness: There is abdominal tenderness in the right lower quadrant. There is guarding. There is no right CVA tenderness, left CVA tenderness or rebound.     Hernia: A hernia is present. Hernia is present in the right inguinal area (fullness without definite hernia).  Genitourinary:    Testes: Normal.        Right: Tenderness or swelling not present.        Left: Tenderness or swelling not present.  Skin:    General: Skin is warm and dry.     Findings: No rash.   Neurological:     Mental Status: He is alert and oriented to person, place, and time.  Psychiatric:        Mood and Affect: Mood normal.        Behavior: Behavior normal.     Wt Readings from Last 3 Encounters:  11/19/24 202 lb (91.6 kg)  10/09/24 196 lb (88.9 kg)  09/11/24 196 lb (88.9 kg)  BP 118/68   Pulse 72   Wt 202 lb (91.6 kg)   SpO2 95%   BMI 29.83 kg/m   Assessment and Plan:  Problem List Items Addressed This Visit       Unprioritized   Eustachian tube dysfunction, bilateral   Recurrent symptoms Continue Flonase; use sudafed bid prn discomfort No evidence of infection on exam today      Other Visit Diagnoses       Colicky RLQ abdominal pain    -  Primary   concern for sub acute appendicitis will get CT scan to evaluate   Relevant Orders   CT ABDOMEN PELVIS W CONTRAST     Right groin pain       with radiation to the right testicle testicular exam is normal fullness suggest possible hernia   Relevant Orders   CT ABDOMEN PELVIS W CONTRAST       No follow-ups on file.    Leita HILARIO Adie, MD [facility] [place]           [1] No Known Allergies [2]  Social History Tobacco Use   Smoking status: Never   Smokeless tobacco: Never  Vaping Use   Vaping status: Never Used  Substance Use Topics   Alcohol use: Yes    Comment: Drink once every few months   Drug use: No  [3]  Current Meds  Medication Sig   clobetasol  ointment (TEMOVATE ) 0.05 % APPLY TO AFFECTED AREA TWICE A DAY   fluticasone (FLONASE) 50 MCG/ACT nasal spray Place daily into both nostrils.   "

## 2024-11-20 ENCOUNTER — Ambulatory Visit
Admission: RE | Admit: 2024-11-20 | Discharge: 2024-11-20 | Disposition: A | Discharge status: Home / Self Care | Source: Ambulatory Visit | Attending: Internal Medicine | Admitting: Internal Medicine

## 2024-11-20 ENCOUNTER — Encounter: Payer: Self-pay | Admitting: Internal Medicine

## 2024-11-20 DIAGNOSIS — R160 Hepatomegaly, not elsewhere classified: Secondary | ICD-10-CM | POA: Diagnosis not present

## 2024-11-20 DIAGNOSIS — K760 Fatty (change of) liver, not elsewhere classified: Secondary | ICD-10-CM | POA: Diagnosis not present

## 2024-11-20 DIAGNOSIS — R1031 Right lower quadrant pain: Secondary | ICD-10-CM | POA: Insufficient documentation

## 2024-11-20 MED ORDER — IOHEXOL 300 MG/ML  SOLN
100.0000 mL | Freq: Once | INTRAMUSCULAR | Status: AC | PRN
  Administered 2024-11-20: 100 mL via INTRAVENOUS

## 2024-11-22 ENCOUNTER — Ambulatory Visit: Payer: Self-pay | Admitting: Internal Medicine

## 2024-11-22 NOTE — Telephone Encounter (Signed)
 Please review and advise patient.   JM

## 2024-11-25 NOTE — Telephone Encounter (Signed)
 FYI

## 2025-06-25 ENCOUNTER — Encounter: Admitting: Physician Assistant
